# Patient Record
Sex: Male | Born: 1975 | Race: White | Hispanic: No | Marital: Married | Smoking: Current every day smoker
Health system: Southern US, Community
[De-identification: ages and names within clinical notes are randomized; demographics above are authoritative.]

## PROBLEM LIST (undated history)

## (undated) DIAGNOSIS — N289 Disorder of kidney and ureter, unspecified: Secondary | ICD-10-CM

## (undated) DIAGNOSIS — K625 Hemorrhage of anus and rectum: Secondary | ICD-10-CM

## (undated) HISTORY — PX: CARDIAC SURGERY: SHX584

## (undated) HISTORY — DX: Hemorrhage of anus and rectum: K62.5

---

## 2012-05-11 ENCOUNTER — Emergency Department (HOSPITAL_COMMUNITY): Payer: PRIVATE HEALTH INSURANCE

## 2012-05-11 ENCOUNTER — Encounter (HOSPITAL_COMMUNITY): Payer: Self-pay

## 2012-05-11 ENCOUNTER — Emergency Department (HOSPITAL_COMMUNITY)
Admission: EM | Admit: 2012-05-11 | Discharge: 2012-05-11 | Disposition: A | Discharge status: Home / Self Care | Payer: PRIVATE HEALTH INSURANCE | Attending: Emergency Medicine | Admitting: Emergency Medicine

## 2012-05-11 DIAGNOSIS — Z9889 Other specified postprocedural states: Secondary | ICD-10-CM | POA: Insufficient documentation

## 2012-05-11 DIAGNOSIS — M25539 Pain in unspecified wrist: Secondary | ICD-10-CM | POA: Insufficient documentation

## 2012-05-11 DIAGNOSIS — IMO0001 Reserved for inherently not codable concepts without codable children: Secondary | ICD-10-CM | POA: Insufficient documentation

## 2012-05-11 DIAGNOSIS — M25532 Pain in left wrist: Secondary | ICD-10-CM

## 2012-05-11 MED ORDER — PREDNISONE 20 MG PO TABS: ORAL_TABLET | ORAL | Status: DC

## 2012-05-11 MED ORDER — NAPROXEN 375 MG PO TABS: 375.0000 mg | ORAL_TABLET | Freq: Two times a day (BID) | ORAL | Status: DC

## 2012-05-11 NOTE — ED Provider Notes (Signed)
History    This chart was scribed for Donnetta Hutching, MD by Marlyne Beards, ED Scribe. The patient was seen in room . Patient's care was started at 3:04 PM.    CSN: [id number]  Arrival date & time 05/11/12  1359   First MD Initiated Contact with Patient 05/11/12 1504      Chief Complaint  Patient presents with  . Wrist Pain    (Consider location/radiation/quality/duration/timing/severity/associated sxs/prior treatment) Patient is a 37 y.o. male presenting with wrist pain. The history is provided by the patient. No language interpreter was used.  Wrist Pain The problem has been gradually worsening. The symptoms are aggravated by twisting and exertion. Nothing relieves the symptoms. Treatments tried: wrist brace.   Randall Allison is a 37 y.o. male who presents to the Emergency Department complaining of moderate constant left wrist pain onset a week ago. Pt states that when he works the pain radiates up into his left. 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 Pt came into ED wearing a wrist brace. He states that when to [facility] and they believed he may have torn a tendon. Pt denies fever, chills, cough, nausea, vomiting, diarrhea, SOB, weakness, and any other associated symptoms. Pt has hx of cardiac surgery.   History reviewed. No pertinent past medical history.  Past Surgical History  Procedure Laterality Date  . Cardiac surgery      No family history on file.  History  Substance Use Topics  . Smoking status: Never Smoker   . Smokeless tobacco: Not on file  . Alcohol Use: Yes     Comment: 1 or 2 beers/day      Review of Systems  Musculoskeletal: Positive for myalgias and arthralgias.  All other systems reviewed and are negative.    Allergies  Review of patient's allergies indicates no known allergies.  Home Medications  No current outpatient prescriptions on file.  BP 124/83  Pulse 118  Temp(Src) 97.2 F (36.2 C)  (Oral)  Resp 18  Ht 5\' 8"  (1.727 m)  Wt 151 lb (68.493 kg)  BMI 22.96 kg/m2  SpO2 98%  Physical Exam  Nursing note and vitals reviewed. Constitutional: He is oriented to person, place, and time. He appears well-developed and well-nourished.  HENT:  Head: Normocephalic and atraumatic.  Abdominal: Bowel sounds are normal.  Musculoskeletal: He exhibits tenderness.  Tender upon palpation on his left anterior and posterior wrist which goes to the dorsal aspect of his forearm. Pt has a weaker grip in his left hand and strength has decreased.  Neurological: He is alert and oriented to person, place, and time.  Skin: Skin is warm and dry.  Psychiatric: He has a normal mood and affect.    ED Course  Procedures (including critical care time) DIAGNOSTIC STUDIES: Oxygen Saturation is 98% on room air, normal by my interpretation.    COORDINATION OF CARE: 3:14 PM Discussed ED treatment with pt and pt agrees. Discussed with pt of prescmedications and wrist immobilizer. Consulted with pt about giving note to miss work for boss.    Labs Reviewed - No data to display Dg Forearm Left  05/11/2012  *RADIOLOGY REPORT*  Clinical Data: Left wrist and forearm pain and swelling.  No known injury.  LEFT FOREARM - 2 VIEW  Comparison: None.  Findings: Two-view exam shows no evidence of fracture.  No worrisome lytic or sclerotic osseous abnormality.  Overlying soft tissues are unremarkable.  IMPRESSION: Normal exam.  Original Report Authenticated By: Kennith Center, M.D.    Dg Wrist Complete Left  05/11/2012  *RADIOLOGY REPORT*  Clinical Data: Left wrist and forearm pain and swelling.  No history of injury.  LEFT WRIST - COMPLETE 3+ VIEW  Comparison: None.  Findings: There is no evidence for acute fracture.  No dislocation. Carpal alignment is intact.  Soft tissues are unremarkable.  IMPRESSION: Normal exam.   Original Report Authenticated By: Kennith Center, M.D.      No diagnosis found.    MDM  History  and physical consistent with overuse syndrome. Splint, referral to hand surgeon, wrist splint,  Naprosyn 375 mg #30, prednisone taper.  Note given for patient to give to  employer    I personally performed the services described in this documentation, which was scribed in my presence. The recorded information has been reviewed and is accurate.         Donnetta Hutching, MD 05/11/12 [phone]

## 2012-05-11 NOTE — ED Notes (Signed)
Pt c/o pain in left wrist x 1 week.  Says was told at [facility] that he may have torn a tendon.  Pt wearing wrist brace.

## 2012-05-11 NOTE — ED Notes (Signed)
Pt alert & oriented x4, stable gait. Patient given discharge instructions, paperwork & prescription(s). Patient  instructed to stop at the registration desk to finish any additional paperwork. Patient verbalized understanding. Pt left department w/ no further questions. 

## 2016-01-18 ENCOUNTER — Emergency Department (HOSPITAL_COMMUNITY)
Admission: EM | Admit: 2016-01-18 | Discharge: 2016-01-19 | Disposition: A | Discharge status: Home / Self Care | Payer: Self-pay | Attending: Emergency Medicine | Admitting: Emergency Medicine

## 2016-01-18 ENCOUNTER — Emergency Department (HOSPITAL_COMMUNITY): Payer: Self-pay

## 2016-01-18 ENCOUNTER — Encounter (HOSPITAL_COMMUNITY): Payer: Self-pay | Admitting: *Deleted

## 2016-01-18 DIAGNOSIS — J40 Bronchitis, not specified as acute or chronic: Secondary | ICD-10-CM | POA: Insufficient documentation

## 2016-01-18 DIAGNOSIS — Z79899 Other long term (current) drug therapy: Secondary | ICD-10-CM | POA: Insufficient documentation

## 2016-01-18 HISTORY — DX: Disorder of kidney and ureter, unspecified: N28.9

## 2016-01-18 LAB — CBC WITH DIFFERENTIAL/PLATELET
BASOS ABS: 0 10*3/uL (ref 0.0–0.1)
BASOS PCT: 0 %
EOS ABS: 0 10*3/uL (ref 0.0–0.7)
Eosinophils Relative: 0 %
HCT: 41.8 % (ref 39.0–52.0)
HEMOGLOBIN: 13.8 g/dL (ref 13.0–17.0)
Lymphocytes Relative: 17 %
Lymphs Abs: 1.2 10*3/uL (ref 0.7–4.0)
MCH: 32.3 pg (ref 26.0–34.0)
MCHC: 33 g/dL (ref 30.0–36.0)
MCV: 97.9 fL (ref 78.0–100.0)
MONOS PCT: 10 %
Monocytes Absolute: 0.8 10*3/uL (ref 0.1–1.0)
NEUTROS PCT: 73 %
Neutro Abs: 5.2 10*3/uL (ref 1.7–7.7)
Platelets: 173 10*3/uL (ref 150–400)
RBC: 4.27 MIL/uL (ref 4.22–5.81)
RDW: 12.3 % (ref 11.5–15.5)
WBC: 7.2 10*3/uL (ref 4.0–10.5)

## 2016-01-18 LAB — I-STAT CHEM 8, ED
BUN: 9 mg/dL (ref 6–20)
Calcium, Ion: 1.05 mmol/L — ABNORMAL LOW (ref 1.15–1.40)
Chloride: 98 mmol/L — ABNORMAL LOW (ref 101–111)
Creatinine, Ser: 0.8 mg/dL (ref 0.61–1.24)
Glucose, Bld: 126 mg/dL — ABNORMAL HIGH (ref 65–99)
HEMATOCRIT: 40 % (ref 39.0–52.0)
Hemoglobin: 13.6 g/dL (ref 13.0–17.0)
POTASSIUM: 3.6 mmol/L (ref 3.5–5.1)
SODIUM: 139 mmol/L (ref 135–145)
TCO2: 28 mmol/L (ref 0–100)

## 2016-01-18 MED ORDER — METOCLOPRAMIDE HCL 5 MG/ML IJ SOLN
10.0000 mg | Freq: Once | INTRAMUSCULAR | Status: AC
  Administered 2016-01-18: 10 mg via INTRAVENOUS
  Filled 2016-01-18: qty 2

## 2016-01-18 MED ORDER — SODIUM CHLORIDE 0.9 % IV BOLUS (SEPSIS)
1000.0000 mL | Freq: Once | INTRAVENOUS | Status: AC
  Administered 2016-01-18: 1000 mL via INTRAVENOUS

## 2016-01-18 MED ORDER — KETOROLAC TROMETHAMINE 30 MG/ML IJ SOLN
30.0000 mg | Freq: Once | INTRAMUSCULAR | Status: AC
  Administered 2016-01-18: 30 mg via INTRAVENOUS
  Filled 2016-01-18: qty 1

## 2016-01-18 MED ORDER — DIPHENHYDRAMINE HCL 50 MG/ML IJ SOLN
25.0000 mg | Freq: Once | INTRAMUSCULAR | Status: AC
  Administered 2016-01-18: 25 mg via INTRAVENOUS
  Filled 2016-01-18: qty 1

## 2016-01-18 NOTE — ED Provider Notes (Signed)
[facility] DEPT Provider Note   CSN: [id number] Arrival date & time: 01/18/16  2100     History   Chief Complaint Chief Complaint  Patient presents with  . Generalized Body Aches    HPI Randall Allison is a 41 y.o. male.  HPI   Randall Allison is a 41 y.o. male who presents to the Emergency Department complaining of generalized body aches, sore throat, cough chills and fatigue for two weeks.  He describes a cough that is productive at times and associated with chest tightness.  He also describes a frontal headache for several days throbbing in quality and worse with cough.  He states that his neck and back hurt secondary to coughing.  He has tried OTC cough and cold medications without relief.  He denies chest pain, shortness of breath, abdominal pain, fever, , vomiting, neck stiffness, rash or urinary symptoms.     Past Medical History:  Diagnosis Date  . kidney stones     There are no active problems to display for this patient.   Past Surgical History:  Procedure Laterality Date  . CARDIAC SURGERY     41 years old       Home Medications    Prior to Admission medications   Medication Sig Start Date End Date Taking? Authorizing Provider  acetaminophen (TYLENOL) 500 MG tablet Take 500 mg by mouth every 6 (six) hours as needed for mild pain or moderate pain.   Yes Historical Provider, MD  Aspirin-Acetaminophen-Caffeine (GOODY HEADACHE PO) Take 1-2 packets by mouth daily as needed (for pain).   Yes Historical Provider, MD  aspirin-sod bicarb-citric acid (ALKA-SELTZER) 325 MG TBEF tablet Take 325 mg by mouth every 6 (six) hours as needed.   Yes Historical Provider, MD  Dextromethorphan HBr (COUGH SUPPRESSANT PO) Take 5-10 mLs by mouth daily as needed (for cough).   Yes Historical Provider, MD  Phenylephrine-DM-GG-APAP (TYLENOL COLD/FLU SEVERE) 5-10-200-325 MG TABS Take 1-2 tablets by mouth daily as needed (for cold and flu symptoms).   Yes Historical Provider, MD    Pseudoeph-Doxylamine-DM-APAP (DAYQUIL/NYQUIL COLD/FLU RELIEF PO) Take 1-2 capsules by mouth daily as needed (for cold symptoms).   Yes Historical Provider, MD    Family History History reviewed. No pertinent family history.  Social History Social History  Substance Use Topics  . Smoking status: Never Smoker  . Smokeless tobacco: Never Used  . Alcohol use Yes     Comment: 1 or 2 beers/day     Allergies   Patient has no known allergies.   Review of Systems Review of Systems  Constitutional: Positive for chills. Negative for activity change, appetite change and fever.  HENT: Positive for congestion, rhinorrhea and sore throat. Negative for facial swelling and trouble swallowing.   Eyes: Negative for visual disturbance.  Respiratory: Positive for cough and chest tightness. Negative for shortness of breath, wheezing and stridor.   Cardiovascular: Negative for chest pain.  Gastrointestinal: Negative for abdominal pain, nausea and vomiting.  Genitourinary: Negative for difficulty urinating, dysuria and flank pain.  Musculoskeletal: Positive for back pain, myalgias and neck pain. Negative for arthralgias and neck stiffness.  Skin: Negative.  Negative for rash.  Neurological: Negative for dizziness, weakness, numbness and headaches.  Hematological: Negative for adenopathy.  Psychiatric/Behavioral: Negative for confusion.  All other systems reviewed and are negative.    Physical Exam Updated Vital Signs BP 113/73 (BP Location: Right Arm)   Pulse 93   Temp 97.4 F (36.3 C) (Oral)   Resp 18  Ht 5\' 8"  (1.727 m)   Wt 68 kg   SpO2 97%   BMI 22.81 kg/m   Physical Exam  Constitutional: He is oriented to person, place, and time. He appears well-developed and well-nourished. No distress.  HENT:  Head: Normocephalic and atraumatic.  Right Ear: Tympanic membrane and ear canal normal.  Left Ear: Tympanic membrane and ear canal normal.  Nose: No rhinorrhea.  Mouth/Throat: Uvula  is midline and mucous membranes are normal. No uvula swelling. Posterior oropharyngeal erythema present. No oropharyngeal exudate, posterior oropharyngeal edema or tonsillar abscesses.  Eyes: EOM are normal. Pupils are equal, round, and reactive to light.  Neck: Trachea normal, normal range of motion and phonation normal. Neck supple. No spinous process tenderness and no muscular tenderness present. No neck rigidity. No Kernig's sign noted.  Cardiovascular: Normal rate, regular rhythm and intact distal pulses.   No murmur heard. Pulmonary/Chest: Effort normal. No respiratory distress.  Coarse lung sounds, no rales.  Few scattered expiratory wheezes.    Abdominal: Soft. He exhibits no distension. There is no tenderness. There is no guarding.  Musculoskeletal: Normal range of motion.  Neurological: He is alert and oriented to person, place, and time. He has normal strength. No cranial nerve deficit or sensory deficit. He exhibits normal muscle tone. Coordination and gait normal. GCS eye subscore is 4. GCS verbal subscore is 5. GCS motor subscore is 6.  Reflex Scores:      Tricep reflexes are 2+ on the right side and 2+ on the left side.      Bicep reflexes are 2+ on the right side and 2+ on the left side. Skin: Skin is warm and dry.  Psychiatric: He has a normal mood and affect.  Nursing note and vitals reviewed.    ED Treatments / Results  Labs (all labs ordered are listed, but only abnormal results are displayed) Labs Reviewed  I-STAT CHEM 8, ED - Abnormal; Notable for the following:       Result Value   Chloride 98 (*)    Glucose, Bld 126 (*)    Calcium, Ion 1.05 (*)    All other components within normal limits  CBC WITH DIFFERENTIAL/PLATELET  URINALYSIS, ROUTINE W REFLEX MICROSCOPIC    EKG  EKG Interpretation None       Radiology Dg Chest 2 View  Result Date: 01/18/2016 CLINICAL DATA:  Headache, myalgias, sore throat and productive cough for 2 weeks. EXAM: CHEST  2 VIEW  COMPARISON:  None. FINDINGS: Minor linear lung base opacities bilaterally due to scarring or atelectasis. There is no airspace consolidation. There is no effusion. Pulmonary vasculature is normal. Hilar, mediastinal and cardiac contours are unremarkable. IMPRESSION: Linear scarring or atelectasis in the bases. No consolidation. No effusion. Electronically Signed   By: Andreas Newport M.D.   On: 01/18/2016 23:44    Procedures Procedures (including critical care time)  Medications Ordered in ED Medications  sodium chloride 0.9 % bolus 1,000 mL (1,000 mLs Intravenous New Bag/Given 01/18/16 2303)  ketorolac (TORADOL) 30 MG/ML injection 30 mg (30 mg Intravenous Given 01/18/16 2304)  metoCLOPramide (REGLAN) injection 10 mg (10 mg Intravenous Given 01/18/16 2304)  diphenhydrAMINE (BENADRYL) injection 25 mg (25 mg Intravenous Given 01/18/16 2304)     Initial Impression / Assessment and Plan / ED Course  I have reviewed the triage vital signs and the nursing notes.  Pertinent labs & imaging results that were available during my care of the patient were reviewed by me and considered in my  medical decision making (see chart for details).  Clinical Course    Pt with flu like symptoms for two weeks.  Headache of gradual onset.  No nuchal rigidity.  Non-toxic appearing.  Vitals stable.  No hx of fever.    Pt also seen by Dr. Roderic Palau and care plan discussed  0110  On recheck, pt is resting comfortably.  No respiratory distress noted.  Headache improved.  He has ate crackers and drank fluids.  Reports feeling better and requesting discharge.  I will tx for bronchitis.  Anti-tussive, Z pack, ibuprofen and albuterol MDI dispensed.  Referral info given to establish PCP.  Return precautions also given.   Final Clinical Impressions(s) / ED Diagnoses   Final diagnoses:  Bronchitis    New Prescriptions New Prescriptions   No medications on file     Bufford Lope 01/19/16 0125    Milton Ferguson,  MD 01/19/16 2350

## 2016-01-18 NOTE — ED Triage Notes (Signed)
Pt has headache, generalized body aches, sore throat, back pain, fatigue, and productive cough (yellowish phlegm) since Christmas Eve.

## 2016-01-19 LAB — URINALYSIS, ROUTINE W REFLEX MICROSCOPIC
BILIRUBIN URINE: NEGATIVE
Glucose, UA: NEGATIVE mg/dL
HGB URINE DIPSTICK: NEGATIVE
KETONES UR: NEGATIVE mg/dL
Leukocytes, UA: NEGATIVE
NITRITE: NEGATIVE
PH: 7 (ref 5.0–8.0)
Protein, ur: NEGATIVE mg/dL
Specific Gravity, Urine: 1.011 (ref 1.005–1.030)

## 2016-01-19 MED ORDER — PROMETHAZINE-DM 6.25-15 MG/5ML PO SYRP: 5.0000 mL | ORAL_SOLUTION | Freq: Four times a day (QID) | ORAL | 0 refills | Status: DC | PRN

## 2016-01-19 MED ORDER — AZITHROMYCIN 250 MG PO TABS: ORAL_TABLET | ORAL | 0 refills | Status: DC

## 2016-01-19 MED ORDER — ALBUTEROL SULFATE HFA 108 (90 BASE) MCG/ACT IN AERS
2.0000 | INHALATION_SPRAY | Freq: Once | RESPIRATORY_TRACT | Status: AC
  Administered 2016-01-19: 2 via RESPIRATORY_TRACT
  Filled 2016-01-19: qty 6.7

## 2016-01-19 MED ORDER — IBUPROFEN 800 MG PO TABS: 800.0000 mg | ORAL_TABLET | Freq: Three times a day (TID) | ORAL | 0 refills | Status: DC

## 2016-01-19 NOTE — ED Notes (Signed)
Pt and family updated on plan of care, still unable to obtain urine sample,

## 2016-01-19 NOTE — Discharge Instructions (Signed)
2 puffs of the inhaler 4 times a day as needed.  Plenty of fluids.  You can contact one of the clinics listed to establish primary care.  Return here for any worsening symptoms

## 2018-11-05 ENCOUNTER — Emergency Department (HOSPITAL_COMMUNITY): Payer: Self-pay

## 2018-11-05 ENCOUNTER — Emergency Department (HOSPITAL_COMMUNITY)
Admission: EM | Admit: 2018-11-05 | Discharge: 2018-11-05 | Disposition: A | Discharge status: Home / Self Care | Payer: Self-pay | Attending: Emergency Medicine | Admitting: Emergency Medicine

## 2018-11-05 ENCOUNTER — Other Ambulatory Visit: Payer: Self-pay

## 2018-11-05 ENCOUNTER — Encounter (HOSPITAL_COMMUNITY): Payer: Self-pay | Admitting: Emergency Medicine

## 2018-11-05 DIAGNOSIS — R1013 Epigastric pain: Secondary | ICD-10-CM | POA: Insufficient documentation

## 2018-11-05 DIAGNOSIS — R1010 Upper abdominal pain, unspecified: Secondary | ICD-10-CM

## 2018-11-05 DIAGNOSIS — Z79899 Other long term (current) drug therapy: Secondary | ICD-10-CM | POA: Insufficient documentation

## 2018-11-05 LAB — CBC
HCT: 47.9 % (ref 39.0–52.0)
Hemoglobin: 15.6 g/dL (ref 13.0–17.0)
MCH: 31.6 pg (ref 26.0–34.0)
MCHC: 32.6 g/dL (ref 30.0–36.0)
MCV: 97.2 fL (ref 80.0–100.0)
Platelets: 288 10*3/uL (ref 150–400)
RBC: 4.93 MIL/uL (ref 4.22–5.81)
RDW: 12.2 % (ref 11.5–15.5)
WBC: 7.5 10*3/uL (ref 4.0–10.5)
nRBC: 0 % (ref 0.0–0.2)

## 2018-11-05 LAB — DIFFERENTIAL
Abs Immature Granulocytes: 0.01 10*3/uL (ref 0.00–0.07)
Basophils Absolute: 0.1 10*3/uL (ref 0.0–0.1)
Basophils Relative: 1 %
Eosinophils Absolute: 0.2 10*3/uL (ref 0.0–0.5)
Eosinophils Relative: 2 %
Immature Granulocytes: 0 %
Lymphocytes Relative: 34 %
Lymphs Abs: 2.6 10*3/uL (ref 0.7–4.0)
Monocytes Absolute: 0.5 10*3/uL (ref 0.1–1.0)
Monocytes Relative: 7 %
Neutro Abs: 4.1 10*3/uL (ref 1.7–7.7)
Neutrophils Relative %: 56 %

## 2018-11-05 LAB — COMPREHENSIVE METABOLIC PANEL
ALT: 27 U/L (ref 0–44)
AST: 20 U/L (ref 15–41)
Albumin: 4 g/dL (ref 3.5–5.0)
Alkaline Phosphatase: 98 U/L (ref 38–126)
Anion gap: 7 (ref 5–15)
BUN: 13 mg/dL (ref 6–20)
CO2: 27 mmol/L (ref 22–32)
Calcium: 8.7 mg/dL — ABNORMAL LOW (ref 8.9–10.3)
Chloride: 104 mmol/L (ref 98–111)
Creatinine, Ser: 0.91 mg/dL (ref 0.61–1.24)
GFR calc Af Amer: >60 mL/min (ref >60)
GFR calc non Af Amer: >60 mL/min (ref >60)
Glucose, Bld: 138 mg/dL — ABNORMAL HIGH (ref 70–99)
Potassium: 4.6 mmol/L (ref 3.5–5.1)
Sodium: 138 mmol/L (ref 135–145)
Total Bilirubin: 0.3 mg/dL (ref 0.3–1.2)
Total Protein: 7.3 g/dL (ref 6.5–8.1)

## 2018-11-05 LAB — LIPASE, BLOOD: Lipase: 44 U/L (ref 11–51)

## 2018-11-05 MED ORDER — HYDROMORPHONE HCL 1 MG/ML IJ SOLN
1.0000 mg | Freq: Once | INTRAMUSCULAR | Status: AC
  Administered 2018-11-05: 1 mg via INTRAVENOUS
  Filled 2018-11-05: qty 1

## 2018-11-05 MED ORDER — PANTOPRAZOLE SODIUM 40 MG IV SOLR
40.0000 mg | Freq: Once | INTRAVENOUS | Status: AC
  Administered 2018-11-05: 40 mg via INTRAVENOUS
  Filled 2018-11-05: qty 40

## 2018-11-05 MED ORDER — LORAZEPAM 2 MG/ML IJ SOLN
0.5000 mg | Freq: Once | INTRAMUSCULAR | Status: AC
  Administered 2018-11-05: 0.5 mg via INTRAVENOUS
  Filled 2018-11-05: qty 1

## 2018-11-05 MED ORDER — HYDROCODONE-ACETAMINOPHEN 5-325 MG PO TABS: 1.0000 | ORAL_TABLET | Freq: Four times a day (QID) | ORAL | 0 refills | Status: DC | PRN

## 2018-11-05 MED ORDER — ONDANSETRON HCL 4 MG/2ML IJ SOLN
4.0000 mg | Freq: Once | INTRAMUSCULAR | Status: AC
  Administered 2018-11-05: 4 mg via INTRAVENOUS
  Filled 2018-11-05: qty 2

## 2018-11-05 MED ORDER — IOHEXOL 300 MG/ML  SOLN
100.0000 mL | Freq: Once | INTRAMUSCULAR | Status: AC | PRN
  Administered 2018-11-05: 100 mL via INTRAVENOUS

## 2018-11-05 MED ORDER — SODIUM CHLORIDE 0.9 % IV BOLUS
1000.0000 mL | Freq: Once | INTRAVENOUS | Status: AC
  Administered 2018-11-05: 1000 mL via INTRAVENOUS

## 2018-11-05 MED ORDER — SODIUM CHLORIDE 0.9% FLUSH
3.0000 mL | Freq: Once | INTRAVENOUS | Status: AC
  Administered 2018-11-05: 3 mL via INTRAVENOUS

## 2018-11-05 NOTE — Discharge Instructions (Addendum)
Follow-up with Dr. Constance Haw on Tuesday at 1015.  Contact her Monday morning if this is not a convenient time and return if any problem

## 2018-11-05 NOTE — ED Triage Notes (Signed)
Patient c/o mid abd pain that started last night. Patient reports nausea and vomiting. Denies any diarrhea or fevers. Last BM 2 days ago which is usually for him. Patient reports rectal bleeding last week with a large amount of bright red blood-denies any more since then.

## 2018-11-05 NOTE — ED Provider Notes (Signed)
[facility] EMERGENCY DEPARTMENT Provider Note   CSN: [id number] Arrival date & time: 11/05/18  1555     History   Chief Complaint Chief Complaint  Patient presents with   Abdominal Pain    HPI Randall Allison is a 43 y.o. male.     Patient complains of pain superior to his umbilicus.  This been hurting off and on for a while.  It was severe today  The history is provided by the patient. No language interpreter was used.  Abdominal Pain Pain location:  Epigastric Pain quality: aching   Pain radiates to:  Does not radiate Pain severity:  Moderate Onset quality:  Sudden Timing:  Constant Progression:  Worsening Chronicity:  New Context: not alcohol use   Associated symptoms: no chest pain, no cough, no diarrhea, no fatigue and no hematuria     Past Medical History:  Diagnosis Date   kidney stones     There are no active problems to display for this patient.   Past Surgical History:  Procedure Laterality Date   CARDIAC SURGERY     43 years old        Home Medications    Prior to Admission medications   Medication Sig Start Date End Date Taking? Authorizing Provider  Aspirin-Acetaminophen-Caffeine (GOODY HEADACHE PO) Take 1-2 packets by mouth 3 (three) times daily.    Yes [provider]  calcium carbonate (TUMS EX) 750 MG chewable tablet Chew 1 tablet by mouth daily as needed for heartburn.   Yes [provider]  HYDROcodone-acetaminophen (NORCO/VICODIN) 5-325 MG tablet Take 1 tablet by mouth every 6 (six) hours as needed. 11/05/18   Milton Ferguson, MD    Family History History reviewed. No pertinent family history.  Social History Social History   Tobacco Use   Smoking status: Never Smoker   Smokeless tobacco: Never Used  Substance Use Topics   Alcohol use: Yes    Comment: 1 or 2 beers/day   Drug use: No     Allergies   Patient has no known allergies.   Review of Systems Review of Systems  Constitutional:  Negative for appetite change and fatigue.  HENT: Negative for congestion, ear discharge and sinus pressure.   Eyes: Negative for discharge.  Respiratory: Negative for cough.   Cardiovascular: Negative for chest pain.  Gastrointestinal: Positive for abdominal pain. Negative for diarrhea.  Genitourinary: Negative for frequency and hematuria.  Musculoskeletal: Negative for back pain.  Skin: Negative for rash.  Neurological: Negative for seizures and headaches.  Psychiatric/Behavioral: Negative for hallucinations.     Physical Exam Updated Vital Signs BP 112/81    Pulse 96    Temp (!) 97.5 F (36.4 C) (Oral)    Resp 17    Ht 5\' 9"  (1.753 m)    Wt 68.9 kg    SpO2 97%    BMI 22.45 kg/m   Physical Exam Vitals signs and nursing note reviewed.  Constitutional:      Appearance: He is well-developed.  HENT:     Head: Normocephalic.     Nose: Nose normal.     Mouth/Throat:     Mouth: Mucous membranes are moist.  Eyes:     General: No scleral icterus.    Conjunctiva/sclera: Conjunctivae normal.  Neck:     Musculoskeletal: Neck supple.     Thyroid: No thyromegaly.  Cardiovascular:     Rate and Rhythm: Normal rate and regular rhythm.     Heart sounds: No murmur. No friction rub.  No gallop.   Pulmonary:     Breath sounds: No stridor. No wheezing or rales.  Chest:     Chest wall: No tenderness.  Abdominal:     General: There is no distension.     Tenderness: There is abdominal tenderness. There is no rebound.  Musculoskeletal: Normal range of motion.  Lymphadenopathy:     Cervical: No cervical adenopathy.  Skin:    Findings: No erythema or rash.  Neurological:     Mental Status: He is oriented to person, place, and time.     Motor: No abnormal muscle tone.     Coordination: Coordination normal.  Psychiatric:        Behavior: Behavior normal.      ED Treatments / Results  Labs (all labs ordered are listed, but only abnormal results are displayed) Labs Reviewed    COMPREHENSIVE METABOLIC PANEL - Abnormal; Notable for the following components:      Result Value   Glucose, Bld 138 (*)    Calcium 8.7 (*)    All other components within normal limits  LIPASE, BLOOD  CBC  DIFFERENTIAL  URINALYSIS, ROUTINE W REFLEX MICROSCOPIC    EKG None  Radiology Ct Abdomen Pelvis W Contrast  Result Date: 11/05/2018 CLINICAL DATA:  Abdominal distension, mid abdominal pain, nausea, vomiting, episode of rectal bleeding EXAM: CT ABDOMEN AND PELVIS WITH CONTRAST TECHNIQUE: Multidetector CT imaging of the abdomen and pelvis was performed using the standard protocol following bolus administration of intravenous contrast. CONTRAST:  161mL OMNIPAQUE IOHEXOL 300 MG/ML  SOLN COMPARISON:  None. FINDINGS: Lower chest: No acute abnormality. Hepatobiliary: No solid liver abnormality is seen. Calcified gallstone in the gallbladder. No gallbladder wall thickening, or biliary dilatation. Pancreas: Unremarkable. No pancreatic ductal dilatation or surrounding inflammatory changes. Spleen: Normal in size without significant abnormality. Adrenals/Urinary Tract: Adrenal glands are unremarkable. Kidneys are normal, without renal calculi, solid lesion, or hydronephrosis. Bladder is unremarkable. Stomach/Bowel: Stomach is within normal limits. Appendix appears normal. No evidence of bowel wall thickening, distention, or inflammatory changes. Sigmoid diverticulosis. Large burden of stool in the proximal colon. Vascular/Lymphatic: Scattered IVC and iliac vein calcifications. No enlarged abdominal or pelvic lymph nodes. Reproductive: No mass or other significant abnormality. Other: There is a fat containing midline ventral epigastric hernia, measuring approximately 4.0 x 3.2 cm, hernia neck measuring 2.3 cm (series 2, image 39). No abdominopelvic ascites. Musculoskeletal: No acute or significant osseous findings. IMPRESSION: 1. There is a fat containing midline ventral epigastric hernia, measuring  approximately 4.0 x 3.2 cm, hernia neck measuring 2.3 cm (series 2, image 39). 2.  Cholelithiasis. 3.  Sigmoid diverticulosis. 4. 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 Electronically Signed   By: Eddie Candle M.D.   On: 11/05/2018 19:05    Procedures Procedures (including critical care time)  Medications Ordered in ED Medications  sodium chloride flush (NS) 0.9 % injection 3 mL (3 mLs Intravenous Given 11/05/18 1623)  HYDROmorphone (DILAUDID) injection 1 mg (1 mg Intravenous Given 11/05/18 1622)  LORazepam (ATIVAN) injection 0.5 mg (0.5 mg Intravenous Given 11/05/18 1629)  ondansetron (ZOFRAN) injection 4 mg (4 mg Intravenous Given 11/05/18 1622)  sodium chloride 0.9 % bolus 1,000 mL (0 mLs Intravenous Stopped 11/05/18 1928)  iohexol (OMNIPAQUE) 300 MG/ML solution 100 mL (100 mLs Intravenous Contrast Given 11/05/18 1814)  pantoprazole (PROTONIX) injection 40 mg (40 mg Intravenous Given 11/05/18 1928)     Initial Impression / Assessment and Plan / ED Course  I  have reviewed the triage vital signs and the nursing notes.  Pertinent labs & imaging results that were available during my care of the patient were reviewed by me and considered in my medical decision making (see chart for details).        Patient's pain improved with pain medicine.  CT scan shows fat-containing hernias.  Umbilicus.  I spoke with Dr. Constance Haw general surgery and she will see the patient in 2 days and he will be discharged home with pain medicine  Final Clinical Impressions(s) / ED Diagnoses   Final diagnoses:  Pain of upper abdomen    ED Discharge Orders         Ordered    HYDROcodone-acetaminophen (NORCO/VICODIN) 5-325 MG tablet  Every 6 hours PRN     11/05/18 1949           Milton Ferguson, MD 11/05/18 1951

## 2018-11-08 ENCOUNTER — Other Ambulatory Visit: Payer: Self-pay

## 2018-11-08 ENCOUNTER — Encounter: Payer: Self-pay | Admitting: General Surgery

## 2018-11-08 ENCOUNTER — Ambulatory Visit (INDEPENDENT_AMBULATORY_CARE_PROVIDER_SITE_OTHER): Payer: Self-pay | Admitting: General Surgery

## 2018-11-08 VITALS — BP 111/72 | HR 89 | Temp 96.8°F | Resp 16 | Ht 69.0 in | Wt 165.0 lb

## 2018-11-08 DIAGNOSIS — K439 Ventral hernia without obstruction or gangrene: Secondary | ICD-10-CM

## 2018-11-08 DIAGNOSIS — R112 Nausea with vomiting, unspecified: Secondary | ICD-10-CM

## 2018-11-08 DIAGNOSIS — K625 Hemorrhage of anus and rectum: Secondary | ICD-10-CM

## 2018-11-08 NOTE — Progress Notes (Signed)
[facility] History and Physical  Reason for Referral: Supraumbilical hernia  Referring Physician:  ED   Chief Complaint    Umbilical Hernia      Randall Allison is a 43 y.o. male.  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 He has had a worsening bulge per his report, and his wife says she has noticed it for a while, possibly longer than the 6 months.  The patient says that the area becomes very painful at times, but he also has some epigastric /upper chest pain and nausea/vomiting that comes and goes and is not associated with food and no RUQ pain. The bulge does not have to super painful or tender when he gets these episodes of nausea/vomiting. He denise any weight loss, and say that he can have constipation and then have loose stools. He does report some rectal bleeding on occasion.  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  He is in the truck which is bumpy, and says that his symptoms are worse but his diet is also worse during that time period.  He has never been seen by GI and does have some reflux symptoms and takes Goodie's powder per his medicine history.  He has no family history of any colon cancer that he is aware.    He had cardiac surgery as a child at age 62 yo. He says he had a murmur and had to get open heart surgery.  Based on his description it sounds like he had a VSD repair potentially. He has had no further cardiac issues, no chest pain, no shortness of breath.   Past Medical History:  Diagnosis Date  . BRBPR (bright red blood per rectum)   . kidney stones     Past Surgical History:  Procedure Laterality Date  . CARDIAC SURGERY     43 years old    Family History  Problem Relation Age of Onset  . Hernia Father   . Prostate cancer Father   . Renal cancer Father   . Hernia Brother     Social History    Tobacco Use  . Smoking status: Never Smoker  . Smokeless tobacco: Never Used  Substance Use Topics  . Alcohol use: Yes    Comment: 1 or 2 beers/day  . Drug use: No    Medications: I have reviewed the patient's current medications. Allergies as of 11/08/2018   No Known Allergies     Medication List       Accurate as of November 08, 2018 11:59 PM. If you have any questions, ask your nurse or doctor.        calcium carbonate 750 MG chewable tablet Commonly known as: TUMS EX Chew 1 tablet by mouth daily as needed for heartburn.   GOODY HEADACHE PO Take 1-2 packets by mouth 3 (three) times daily.   HYDROcodone-acetaminophen 5-325 MG tablet Commonly known as: NORCO/VICODIN Take 1 tablet by mouth every 6 (six) hours as needed.        ROS:  A comprehensive review of systems was negative except for: Gastrointestinal: positive for abdominal pain, nausea, reflux symptoms, vomiting and BRBPR Neurological: positive for dizziness  Blood pressure 111/72, pulse 89, temperature (!) 96.8 F (36 C), temperature source Temporal, resp. rate 16, height 5\' 9"  (1.753 m),  weight 165 lb (74.8 kg), SpO2 97 %. Physical Exam Vitals signs reviewed.  Constitutional:      Appearance: Normal appearance.  HENT:     Head: Normocephalic and atraumatic.     Nose: Nose normal.     Mouth/Throat:     Mouth: Mucous membranes are moist.  Eyes:     Extraocular Movements: Extraocular movements intact.     Pupils: Pupils are equal, round, and reactive to light.  Neck:     Musculoskeletal: Normal range of motion. No neck rigidity.  Cardiovascular:     Rate and Rhythm: Normal rate and regular rhythm.  Pulmonary:     Effort: Pulmonary effort is normal.     Breath sounds: Normal breath sounds.  Abdominal:     General: There is no distension.     Palpations: Abdomen is soft.     Tenderness: There is no abdominal tenderness.     Hernia: A hernia is present.     Comments: Supraumbilical hernia, tender,  about 2-3cm defect, reducible  Genitourinary:    Prostate: Not tender.     Rectum: No mass, anal fissure or external hemorrhoid.     Comments: No gross blood Musculoskeletal: Normal range of motion.        General: No swelling.  Skin:    General: Skin is warm and dry.  Neurological:     General: No focal deficit present.     Mental Status: He is alert and oriented to person, place, and time.  Psychiatric:        Mood and Affect: Mood normal.        Behavior: Behavior normal.        Thought Content: Thought content normal.     Results: CT a/p personally reviewed- hernia supraumbilical- about 5 cm from the umbilicus, about [id number] wide, containing fat, gallstones  Assessment & Plan:  Randall Allison is a 43 y.o. male with a supraumbilical hernia that is tender and causing some of his pain but I do not think it is causing all of his issues and he has several things going on at this time. He has nausea/vomiting and epigastric pain which could be from gallstones but also could be from gastritis or an ulcer. He denied any RUQ pain or pain with eating. He also has some rectal bleeding that has been going on for a while, and no obvious external hemorrhoids, but potentially has internal hemorrhoids.   -Supraumbilical hernia repair with mesh  -Remaining symptoms should be evaluated by GI to assess with possible EGD and colonoscopy, he may ultimately need his gallbladder out but has no pain with food or in the RUQ so need to rule out ulcer disease etc first    Discussed risk of hernia repair including but not limited to bleeding, infection, recurrence, use of mesh, risk of injury of the bowel, and not helping all of his symptoms including the nausea/vomiting.   Discussed cardiac history with Dr. Wyatt Haste and patient not symptomatic. Do not think we need to get cardiology to see him or ECHO at this time.    All questions were answered to the satisfaction of the patient and family.  I spent 45  minutes with this patient and his wife discussing the symptoms, the options and the plan of care as detailed above. I also called Dr. Wyatt Haste and sent Dr. Laural Golden a message.    [name] 11/10/2018, 8:05 AM

## 2018-11-09 ENCOUNTER — Other Ambulatory Visit (HOSPITAL_COMMUNITY)
Admission: RE | Admit: 2018-11-09 | Discharge: 2018-11-09 | Disposition: A | Discharge status: Home / Self Care | Payer: Self-pay | Source: Ambulatory Visit | Attending: General Surgery | Admitting: General Surgery

## 2018-11-09 ENCOUNTER — Encounter (HOSPITAL_COMMUNITY)
Admission: RE | Admit: 2018-11-09 | Discharge: 2018-11-09 | Disposition: A | Discharge status: Home / Self Care | Payer: Self-pay | Source: Ambulatory Visit | Attending: General Surgery | Admitting: General Surgery

## 2018-11-09 DIAGNOSIS — Z20828 Contact with and (suspected) exposure to other viral communicable diseases: Secondary | ICD-10-CM | POA: Insufficient documentation

## 2018-11-09 DIAGNOSIS — Z01812 Encounter for preprocedural laboratory examination: Secondary | ICD-10-CM | POA: Insufficient documentation

## 2018-11-09 LAB — SARS CORONAVIRUS 2 (TAT 6-24 HRS): SARS Coronavirus 2: NEGATIVE

## 2018-11-10 ENCOUNTER — Encounter: Payer: Self-pay | Admitting: General Surgery

## 2018-11-10 ENCOUNTER — Other Ambulatory Visit: Payer: Self-pay

## 2018-11-10 ENCOUNTER — Encounter (HOSPITAL_COMMUNITY): Payer: Self-pay

## 2018-11-10 DIAGNOSIS — R1110 Vomiting, unspecified: Secondary | ICD-10-CM | POA: Insufficient documentation

## 2018-11-10 DIAGNOSIS — K625 Hemorrhage of anus and rectum: Secondary | ICD-10-CM | POA: Insufficient documentation

## 2018-11-10 NOTE — H&P (Signed)
[facility] History and Physical  Reason for Referral: Supraumbilical hernia  Referring Physician:  ED      Chief Complaint    Umbilical Hernia      Randall Allison is a 43 y.o. male.  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 He has had a worsening bulge per his report, and his wife says she has noticed it for a while, possibly longer than the 6 months.  The patient says that the area becomes very painful at times, but he also has some epigastric /upper chest pain and nausea/vomiting that comes and goes and is not associated with food and no RUQ pain. The bulge does not have to super painful or tender when he gets these episodes of nausea/vomiting. He denise any weight loss, and say that he can have constipation and then have loose stools. He does report some rectal bleeding on occasion.  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  He is in the truck which is bumpy, and says that his symptoms are worse but his diet is also worse during that time period.  He has never been seen by GI and does have some reflux symptoms and takes Goodie's powder per his medicine history.  He has no family history of any colon cancer that he is aware.    He had cardiac surgery as a child at age 25 yo. He says he had a murmur and had to get open heart surgery.  Based on his description it sounds like he had a VSD repair potentially. He has had no further cardiac issues, no chest pain, no shortness of breath.       Past Medical History:  Diagnosis Date  . BRBPR (bright red blood per rectum)   . kidney stones          Past Surgical History:  Procedure Laterality Date  . CARDIAC SURGERY     43 years old         Family History  Problem Relation Age of Onset  . Hernia Father   . Prostate cancer Father   . Renal cancer Father   .  Hernia Brother     Social History        Tobacco Use  . Smoking status: Never Smoker  . Smokeless tobacco: Never Used  Substance Use Topics  . Alcohol use: Yes    Comment: 1 or 2 beers/day  . Drug use: No    Medications: I have reviewed the patient's current medications. Allergies as of 11/08/2018   No Known Allergies        Medication List       Accurate as of November 08, 2018 11:59 PM. If you have any questions, ask your nurse or doctor.        calcium carbonate 750 MG chewable tablet Commonly known as: TUMS EX Chew 1 tablet by mouth daily as needed for heartburn.   GOODY HEADACHE PO Take 1-2 packets by mouth 3 (three) times daily.   HYDROcodone-acetaminophen 5-325 MG tablet Commonly known as: NORCO/VICODIN Take 1 tablet by mouth every 6 (six) hours as needed.        ROS:  A comprehensive review of systems was negative except for: Gastrointestinal: positive for abdominal pain, nausea, reflux symptoms, vomiting and BRBPR Neurological: positive  for dizziness  Blood pressure 111/72, pulse 89, temperature (!) 96.8 F (36 C), temperature source Temporal, resp. rate 16, height 5\' 9"  (1.753 m), weight 165 lb (74.8 kg), SpO2 97 %. Physical Exam Vitals signs reviewed.  Constitutional:      Appearance: Normal appearance.  HENT:     Head: Normocephalic and atraumatic.     Nose: Nose normal.     Mouth/Throat:     Mouth: Mucous membranes are moist.  Eyes:     Extraocular Movements: Extraocular movements intact.     Pupils: Pupils are equal, round, and reactive to light.  Neck:     Musculoskeletal: Normal range of motion. No neck rigidity.  Cardiovascular:     Rate and Rhythm: Normal rate and regular rhythm.  Pulmonary:     Effort: Pulmonary effort is normal.     Breath sounds: Normal breath sounds.  Abdominal:     General: There is no distension.     Palpations: Abdomen is soft.     Tenderness: There is no abdominal tenderness.      Hernia: A hernia is present.     Comments: Supraumbilical hernia, tender, about 2-3cm defect, reducible  Genitourinary:    Prostate: Not tender.     Rectum: No mass, anal fissure or external hemorrhoid.     Comments: No gross blood Musculoskeletal: Normal range of motion.        General: No swelling.  Skin:    General: Skin is warm and dry.  Neurological:     General: No focal deficit present.     Mental Status: He is alert and oriented to person, place, and time.  Psychiatric:        Mood and Affect: Mood normal.        Behavior: Behavior normal.        Thought Content: Thought content normal.     Results: CT a/p personally reviewed- hernia supraumbilical- about 5 cm from the umbilicus, about [id number] wide, containing fat, gallstones  Assessment & Plan:  Randall Allison is a 43 y.o. male with a supraumbilical hernia that is tender and causing some of his pain but I do not think it is causing all of his issues and he has several things going on at this time. He has nausea/vomiting and epigastric pain which could be from gallstones but also could be from gastritis or an ulcer. He denied any RUQ pain or pain with eating. He also has some rectal bleeding that has been going on for a while, and no obvious external hemorrhoids, but potentially has internal hemorrhoids.   -Supraumbilical hernia repair with mesh  -Remaining symptoms should be evaluated by GI to assess with possible EGD and colonoscopy, he may ultimately need his gallbladder out but has no pain with food or in the RUQ so need to rule out ulcer disease etc first    Discussed risk of hernia repair including but not limited to bleeding, infection, recurrence, use of mesh, risk of injury of the bowel, and not helping all of his symptoms including the nausea/vomiting.   Discussed cardiac history with Dr. Wyatt Haste and patient not symptomatic. Do not think we need to get cardiology to see him or ECHO at this time.    All  questions were answered to the satisfaction of the patient and family.  I spent 45 minutes with this patient and his wife discussing the symptoms, the options and the plan of care as detailed above. I also called Dr. Wyatt Haste and sent  Dr. Laural Golden a message.    [name] 11/10/2018, 8:05 AM

## 2018-11-11 ENCOUNTER — Ambulatory Visit (HOSPITAL_COMMUNITY): Payer: Self-pay | Admitting: Anesthesiology

## 2018-11-11 ENCOUNTER — Encounter (HOSPITAL_COMMUNITY): Payer: Self-pay

## 2018-11-11 ENCOUNTER — Other Ambulatory Visit: Payer: Self-pay

## 2018-11-11 ENCOUNTER — Ambulatory Visit (HOSPITAL_COMMUNITY)
Admission: RE | Admit: 2018-11-11 | Discharge: 2018-11-11 | Disposition: A | Discharge status: Home / Self Care | Payer: Self-pay | Attending: General Surgery | Admitting: General Surgery

## 2018-11-11 ENCOUNTER — Encounter (HOSPITAL_COMMUNITY)
Admission: RE | Disposition: A | Discharge status: Home / Self Care | Payer: Self-pay | Source: Home / Self Care | Attending: General Surgery

## 2018-11-11 DIAGNOSIS — K219 Gastro-esophageal reflux disease without esophagitis: Secondary | ICD-10-CM | POA: Insufficient documentation

## 2018-11-11 DIAGNOSIS — K439 Ventral hernia without obstruction or gangrene: Secondary | ICD-10-CM | POA: Insufficient documentation

## 2018-11-11 HISTORY — PX: UMBILICAL HERNIA REPAIR: SHX196

## 2018-11-11 SURGERY — REPAIR, HERNIA, UMBILICAL, ADULT
Anesthesia: General | Site: Abdomen

## 2018-11-11 MED ORDER — SUCCINYLCHOLINE CHLORIDE 200 MG/10ML IV SOSY
PREFILLED_SYRINGE | INTRAVENOUS | Status: AC
  Filled 2018-11-11: qty 10

## 2018-11-11 MED ORDER — MIDAZOLAM HCL 5 MG/5ML IJ SOLN
INTRAMUSCULAR | Status: DC | PRN
  Administered 2018-11-11: 2 mg via INTRAVENOUS

## 2018-11-11 MED ORDER — LIDOCAINE 2% (20 MG/ML) 5 ML SYRINGE
INTRAMUSCULAR | Status: AC
  Filled 2018-11-11: qty 5

## 2018-11-11 MED ORDER — CHLORHEXIDINE GLUCONATE CLOTH 2 % EX PADS: 6.0000 | MEDICATED_PAD | Freq: Once | CUTANEOUS | Status: DC

## 2018-11-11 MED ORDER — SODIUM CHLORIDE 0.9 % IR SOLN
Status: DC | PRN
  Administered 2018-11-11: 1000 mL

## 2018-11-11 MED ORDER — CEFAZOLIN SODIUM-DEXTROSE 2-4 GM/100ML-% IV SOLN
2.0000 g | INTRAVENOUS | Status: AC
  Administered 2018-11-11: 2 g via INTRAVENOUS
  Filled 2018-11-11: qty 100

## 2018-11-11 MED ORDER — DOCUSATE SODIUM 100 MG PO CAPS: 100.0000 mg | ORAL_CAPSULE | Freq: Two times a day (BID) | ORAL | 2 refills | Status: AC

## 2018-11-11 MED ORDER — DEXAMETHASONE SODIUM PHOSPHATE 10 MG/ML IJ SOLN
INTRAMUSCULAR | Status: AC
  Filled 2018-11-11: qty 1

## 2018-11-11 MED ORDER — SUCRALFATE 1 G PO TABS: 1.0000 g | ORAL_TABLET | Freq: Four times a day (QID) | ORAL | 1 refills | Status: DC

## 2018-11-11 MED ORDER — MIDAZOLAM HCL 2 MG/2ML IJ SOLN
INTRAMUSCULAR | Status: AC
  Filled 2018-11-11: qty 2

## 2018-11-11 MED ORDER — FENTANYL CITRATE (PF) 100 MCG/2ML IJ SOLN
INTRAMUSCULAR | Status: DC | PRN
  Administered 2018-11-11 (×2): 25 ug via INTRAVENOUS
  Administered 2018-11-11 (×2): 50 ug via INTRAVENOUS

## 2018-11-11 MED ORDER — OXYCODONE HCL 5 MG PO TABS: 5.0000 mg | ORAL_TABLET | ORAL | 0 refills | Status: DC | PRN

## 2018-11-11 MED ORDER — ROCURONIUM 10MG/ML (10ML) SYRINGE FOR MEDFUSION PUMP - OPTIME
INTRAVENOUS | Status: DC | PRN
  Administered 2018-11-11: 25 mg via INTRAVENOUS

## 2018-11-11 MED ORDER — ROCURONIUM BROMIDE 10 MG/ML (PF) SYRINGE
PREFILLED_SYRINGE | INTRAVENOUS | Status: AC
  Filled 2018-11-11: qty 10

## 2018-11-11 MED ORDER — ONDANSETRON HCL 4 MG/2ML IJ SOLN: 4.0000 mg | Freq: Once | INTRAMUSCULAR | Status: DC | PRN

## 2018-11-11 MED ORDER — LIDOCAINE HCL (CARDIAC) PF 50 MG/5ML IV SOSY
PREFILLED_SYRINGE | INTRAVENOUS | Status: DC | PRN
  Administered 2018-11-11: 40 mg via INTRAVENOUS

## 2018-11-11 MED ORDER — BUPIVACAINE LIPOSOME 1.3 % IJ SUSP
INTRAMUSCULAR | Status: AC
  Filled 2018-11-11: qty 20

## 2018-11-11 MED ORDER — HYDROMORPHONE HCL 1 MG/ML IJ SOLN
0.2500 mg | INTRAMUSCULAR | Status: DC | PRN
  Administered 2018-11-11: 0.5 mg via INTRAVENOUS
  Filled 2018-11-11: qty 0.5

## 2018-11-11 MED ORDER — SUGAMMADEX SODIUM 200 MG/2ML IV SOLN
INTRAVENOUS | Status: DC | PRN
  Administered 2018-11-11: 200 mg via INTRAVENOUS

## 2018-11-11 MED ORDER — PROPOFOL 10 MG/ML IV BOLUS
INTRAVENOUS | Status: DC | PRN
  Administered 2018-11-11: 120 mg via INTRAVENOUS

## 2018-11-11 MED ORDER — SUCCINYLCHOLINE 20MG/ML (10ML) SYRINGE FOR MEDFUSION PUMP - OPTIME
INTRAMUSCULAR | Status: DC | PRN
  Administered 2018-11-11: 100 mg via INTRAVENOUS

## 2018-11-11 MED ORDER — LACTATED RINGERS IV SOLN
Freq: Once | INTRAVENOUS | Status: AC
  Administered 2018-11-11: 11:00:00 via INTRAVENOUS

## 2018-11-11 MED ORDER — FENTANYL CITRATE (PF) 250 MCG/5ML IJ SOLN
INTRAMUSCULAR | Status: AC
  Filled 2018-11-11: qty 5

## 2018-11-11 MED ORDER — DEXAMETHASONE SODIUM PHOSPHATE 10 MG/ML IJ SOLN
INTRAMUSCULAR | Status: DC | PRN
  Administered 2018-11-11: 10 mg via INTRAVENOUS

## 2018-11-11 MED ORDER — PROPOFOL 10 MG/ML IV BOLUS
INTRAVENOUS | Status: AC
  Filled 2018-11-11: qty 40

## 2018-11-11 MED ORDER — OMEPRAZOLE 40 MG PO CPDR: 40.0000 mg | DELAYED_RELEASE_CAPSULE | Freq: Every day | ORAL | 1 refills | Status: DC

## 2018-11-11 MED ORDER — MEPERIDINE HCL 50 MG/ML IJ SOLN: 6.2500 mg | INTRAMUSCULAR | Status: DC | PRN

## 2018-11-11 MED ORDER — BUPIVACAINE LIPOSOME 1.3 % IJ SUSP
INTRAMUSCULAR | Status: DC | PRN
  Administered 2018-11-11: 20 mL

## 2018-11-11 SURGICAL SUPPLY — 33 items
BLADE SURG 15 STRL LF DISP TIS (BLADE) ×1 IMPLANT
BLADE SURG 15 STRL SS (BLADE) ×2
CHLORAPREP W/TINT 26 (MISCELLANEOUS) ×3 IMPLANT
CLOTH BEACON ORANGE TIMEOUT ST (SAFETY) ×3 IMPLANT
COVER LIGHT HANDLE STERIS (MISCELLANEOUS) ×6 IMPLANT
COVER WAND RF STERILE (DRAPES) ×3 IMPLANT
DERMABOND ADVANCED (GAUZE/BANDAGES/DRESSINGS) ×2
DERMABOND ADVANCED .7 DNX12 (GAUZE/BANDAGES/DRESSINGS) ×1 IMPLANT
ELECT REM PT RETURN 9FT ADLT (ELECTROSURGICAL) ×3
ELECTRODE REM PT RTRN 9FT ADLT (ELECTROSURGICAL) ×1 IMPLANT
GLOVE BIO SURGEON STRL SZ 6.5 (GLOVE) ×2 IMPLANT
GLOVE BIO SURGEONS STRL SZ 6.5 (GLOVE) ×1
GLOVE BIOGEL PI IND STRL 6.5 (GLOVE) ×1 IMPLANT
GLOVE BIOGEL PI IND STRL 7.0 (GLOVE) ×1 IMPLANT
GLOVE BIOGEL PI INDICATOR 6.5 (GLOVE) ×2
GLOVE BIOGEL PI INDICATOR 7.0 (GLOVE) ×2
GLOVE ECLIPSE 6.5 STRL STRAW (GLOVE) ×2 IMPLANT
GOWN STRL REUS W/ TWL LRG LVL3 (GOWN DISPOSABLE) ×1 IMPLANT
GOWN STRL REUS W/TWL LRG LVL3 (GOWN DISPOSABLE) ×7 IMPLANT
INST SET MINOR GENERAL (KITS) ×3 IMPLANT
KIT TURNOVER KIT A (KITS) ×3 IMPLANT
MANIFOLD NEPTUNE II (INSTRUMENTS) ×3 IMPLANT
MESH VENTRALEX ST 1-7/10 CRC S (Mesh General) ×2 IMPLANT
NEEDLE HYPO 22GX1.5 SAFETY (NEEDLE) ×3 IMPLANT
NS IRRIG 1000ML POUR BTL (IV SOLUTION) ×3 IMPLANT
PACK MINOR (CUSTOM PROCEDURE TRAY) ×3 IMPLANT
PAD ARMBOARD 7.5X6 YLW CONV (MISCELLANEOUS) ×3 IMPLANT
SET BASIN LINEN APH (SET/KITS/TRAYS/PACK) ×3 IMPLANT
SUT ETHIBOND NAB MO 7 #0 18IN (SUTURE) ×3 IMPLANT
SUT MNCRL AB 4-0 PS2 18 (SUTURE) ×3 IMPLANT
SUT VIC AB 3-0 SH 27 (SUTURE) ×2
SUT VIC AB 3-0 SH 27X BRD (SUTURE) ×1 IMPLANT
SYR 20ML LL LF (SYRINGE) ×3 IMPLANT

## 2018-11-11 NOTE — Anesthesia Procedure Notes (Signed)
Procedure Name: Intubation Date/Time: 11/11/2018 11:41 AM Performed by: Ollen Bowl, CRNA Pre-anesthesia Checklist: Patient identified, Patient being monitored, Timeout performed, Emergency Drugs available and Suction available Patient Re-evaluated:Patient Re-evaluated prior to induction Oxygen Delivery Method: Circle system utilized Preoxygenation: Pre-oxygenation with 100% oxygen Induction Type: IV induction Ventilation: Mask ventilation without difficulty Laryngoscope Size: Mac and 3 Grade View: Grade I Tube type: Oral Tube size: 7.0 mm Number of attempts: 1 Airway Equipment and Method: Stylet Placement Confirmation: ETT inserted through vocal cords under direct vision,  positive ETCO2 and breath sounds checked- equal and bilateral Secured at: 21 cm Tube secured with: Tape Dental Injury: Teeth and Oropharynx as per pre-operative assessment

## 2018-11-11 NOTE — Transfer of Care (Signed)
Immediate Anesthesia Transfer of Care Note  Patient: Randall Allison  Procedure(s) Performed: HERNIA REPAIR SUPRAUMBILICAL ADULT (N/A Abdomen)  Patient Location: PACU  Anesthesia Type:General  Level of Consciousness: awake and patient cooperative  Airway & Oxygen Therapy: Patient Spontanous Breathing  Post-op Assessment: Report given to RN and Post -op Vital signs reviewed and stable  Post vital signs: Reviewed and stable  Last Vitals:  Vitals Value Taken Time  BP    Temp    Pulse 78 11/11/18 1238  Resp    SpO2 94 % 11/11/18 1238  Vitals shown include unvalidated device data.  Last Pain:  Vitals:   11/11/18 1109  TempSrc: Oral  PainSc: 0-No pain      Patients Stated Pain Goal: 6 ([id number])  Complications: No apparent anesthesia complications  SEE PACU FLOWSHEET FOR VITAL SIGNS

## 2018-11-11 NOTE — Progress Notes (Signed)
[facility]  Talked to Wife Colletta [place] after surgery. Patient should not lift and should wear the abdominal binder. Recommended PPI use and carafate use to help with nausea/vomiting as I am afraid the patient may have gastritis or an ulcer due to his Goodie's powder use. Told patient to stop Goodie's powder.  Post op phone call 11/12 unless he needs to be seen. Have referred to GI for possible endoscopy.  Curlene Labrum, MD [facility] [address] [place], [place] [address] [id number] [phone] (office)

## 2018-11-11 NOTE — Interval H&P Note (Signed)
History and Physical Interval Note:  11/11/2018 11:28 AM  Zara Council  has presented today for surgery, with the diagnosis of SUPRAUMBILICAL HERNIA.  The various methods of treatment have been discussed with the patient and family. After consideration of risks, benefits and other options for treatment, the patient has consented to  Procedure(s): HERNIA REPAIR UMBILICAL ADULT (N/A) as a surgical intervention.  The patient's history has been reviewed, patient examined, no change in status, stable for surgery.  I have reviewed the patient's chart and labs.  Questions were answered to the patient's satisfaction.    Hernia repair. No changes. 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 He wears compression hose. He says that he will cut back. Will prescribe PPI to help and carafate.  [name]

## 2018-11-11 NOTE — Anesthesia Postprocedure Evaluation (Signed)
Anesthesia Post Note  Patient: Ansen Ocasio  Procedure(s) Performed: HERNIA REPAIR SUPRAUMBILICAL ADULT (N/A Abdomen)  Patient location during evaluation: PACU Anesthesia Type: General Level of consciousness: awake and alert and oriented Pain management: pain level controlled Vital Signs Assessment: post-procedure vital signs reviewed and stable Respiratory status: spontaneous breathing Cardiovascular status: blood pressure returned to baseline and stable Postop Assessment: no apparent nausea or vomiting Anesthetic complications: no     Last Vitals:  Vitals:   11/11/18 1315 11/11/18 1325  BP: (!) 131/95 130/87  Pulse: 65 64  Resp: 18 17  Temp:  36.5 C  SpO2: 92% 94%    Last Pain:  Vitals:   11/11/18 1325  TempSrc: Oral  PainSc: 3                  Vivian Okelley

## 2018-11-11 NOTE — Anesthesia Preprocedure Evaluation (Addendum)
Anesthesia Evaluation  Patient identified by MRN, date of birth, ID band Patient awake    Reviewed: Allergy & Precautions, NPO status , Patient's Chart, lab work & pertinent test results  Airway Mallampati: II  TM Distance: >3 FB Neck ROM: Full    Dental  (+) Chipped, Missing, Dental Advisory Given   Pulmonary neg pulmonary ROS,    breath sounds clear to auscultation       Cardiovascular Exercise Tolerance: Good  Rhythm:Regular Rate:Normal + Systolic murmurs VSD Repair   Neuro/Psych negative neurological ROS  negative psych ROS   GI/Hepatic GERD (mild, not on meds)  ,  Endo/Other    Renal/GU Renal disease     Musculoskeletal negative musculoskeletal ROS (+)   Abdominal   Peds  Hematology   Anesthesia Other Findings   Reproductive/Obstetrics negative OB ROS                            Anesthesia Physical Anesthesia Plan  ASA: II  Anesthesia Plan: General   Post-op Pain Management:    Induction: Intravenous  PONV Risk Score and Plan: 3 and Ondansetron, Dexamethasone and Midazolam  Airway Management Planned: Oral ETT  Additional Equipment:   Intra-op Plan:   Post-operative Plan: Extubation in OR  Informed Consent: I have reviewed the patients History and Physical, chart, labs and discussed the procedure including the risks, benefits and alternatives for the proposed anesthesia with the patient or authorized representative who has indicated his/her understanding and acceptance.     Dental advisory given  Plan Discussed with: CRNA  Anesthesia Plan Comments:         Anesthesia Quick Evaluation

## 2018-11-11 NOTE — Op Note (Signed)
[facility] Operative Note  11/11/18  Preoperative Diagnosis: Supraumbilical hernia    Postoperative Diagnosis: Same   Procedure(s) Performed:  Open hernia repair with mesh (4.3cm Ventralex ST Patch)    Surgeon: Lanell Matar. Constance Haw, MD   Assistants: No qualified resident was available    Anesthesia: General endotracheal   Anesthesiologist: Denese Killings, MD    Specimens: None   Estimated Blood Loss: Minimal   Blood Replacement: None    Complications: None   Wound Class: Clean    Operative Indications: Mr.Burley is a 43 yo with a supraumbilical hernia that has been causing him pain. He also has some nausea and vomiting but I told him I did not think this was related to his hernia as it only contains fat. He also takes Goodie's Powder so we discussed stopping these and starting a PPI and Carafate. We discussed the hernia repair and the risk of bleeding, infection, injury to bowel, use of mesh and risk of recurrence. He opted to proceed with repair and has been referred to GI for further evaluation.   Findings: 1.5cm hernia defect in the supraumbilical area with fat    Procedure: The patient was taken to the operating room and placed supine. General endotracheal anesthesia was induced. Intravenous antibiotics were  administered per protocol.  The abdomen was prepared and draped in the usual sterile fashion.   The supraumbilical hernia was noted to be reducible and measured about 1.5cm. An incision was made in the midline over the hernia, and carried down through the subcutaneous tissue with electrocautery.  Dissection was performed down to the level of the fascia, exposing the hernia sac.  The hernia sac was opened with care, and excess hernia sac was resected with electrocautery.  A finger was ran on the underlying peritoneum and this was clear.  A 4.3 cm Ventralex St Hernia Patch was placed and secured with 0 Ethibond sutures ensuring that it was against the  peritoneal cavity.  The hernia defect was then closed with 0 Ethibond suture in an interrupted fashion over the patch.  The deep tissue was closed with 3-0 Vicryl suture.   Hemostasis was confirmed. The skin was closed with a running 4-0 Monocryl suture and dermabond.   All counts were correct at the end of the case. The patient was awakened from anesthesia and extubated without complication.  The patient went to the PACU in stable condition.   Curlene Labrum, MD [facility] [address] [place],  [id number] [phone] (office)

## 2018-11-11 NOTE — Discharge Instructions (Signed)
Discharge Instructions Hernia:  Common Complaints: Pain at the incision site is common. This will improve with time. Take your pain medications as described below. Some nausea is common and poor appetite. The main goal is to stay hydrated the first few days after surgery.  Do not return to work until you speak with Dr. Constance Haw on 11/24/2018.   Diet/ Activity: Diet as tolerated. You may not have an appetite, but it is important to stay hydrated. Drink 64 ounces of water a day. Your appetite will return with time.  Shower per your regular routine daily.  Do not take hot showers. Take warm showers that are less than 10 minutes. Rest and listen to your body, but do not remain in bed all day.Walk everyday for at least 15-20 minutes.  Deep cough and move around every 1-2 hours in the first few days after surgery. Do not pick at the dermabond glue on your incision sites.  This glue film will remain in place for 1-2 weeks and will start to peel off. Do not place lotions or balms on your incision unless instructed to specifically by Dr. Constance Haw. Do not lift > 10 lbs, perform excessive bending, pushing, pulling, squatting for 6-8 weeks after surgery. Where your abdominal binder with activity as much as possible. The activity restrictions and the abdominal binder are to prevent hernia formation at your incision while you are healing.   Medication: Take tylenol and ibuprofen as needed for pain control, alternating every 4-6 hours.  Example:  Tylenol 1000mg  @ 6am, 12noon, 6pm, 88midnight (Do not exceed 4000mg  of tylenol a day). Ibuprofen 800mg  @ 9am, 3pm, 9pm, 3am (Do not exceed 3600mg  of ibuprofen a day).  Take Roxicodone for breakthrough pain every 4 hours.  Take Colace for constipation related to narcotic pain medication. If you do not have a bowel movement in 2 days, take Miralax over the counter.  Drink plenty of water to also prevent constipation.   Contact Information: If you have questions or  concerns, please call our office, [phone], Monday- Thursday 8AM-5PM and Friday 8AM-12Noon.  If it is after hours or on the weekend, please call [facility] Main [facility], [phone], and ask to speak to the surgeon on call for Dr. Constance Haw at [facility].     Open Hernia Repair, Adult, Care After These instructions give you information about caring for yourself after your procedure. Your doctor may also give you more specific instructions. If you have problems or questions, contact your doctor. Follow these instructions at home: Surgical cut (incision) care   Follow instructions from your doctor about how to take care of your surgical cut area. Make sure you: ? Wash your hands with soap and water before you change your bandage (dressing). If you cannot use soap and water, use hand sanitizer. ? Change your bandage as told by your doctor. ? Leave stitches (sutures), skin glue, or skin tape (adhesive) strips in place. They may need to stay in place for 2 weeks or longer. If tape strips get loose and curl up, you may trim the loose edges. Do not remove tape strips completely unless your doctor says it is okay.  Check your surgical cut every day for signs of infection. Check for: ? More redness, swelling, or pain. ? More fluid or blood. ? Warmth. ? Pus or a bad smell. Activity  Do not drive or use heavy machinery while taking prescription pain medicine. Do not drive until your doctor says it is okay.  Until your doctor  says it is okay: ? Do not lift anything that is heavier than 10 lb (4.5 kg). ? Do not play contact sports.  Return to your normal activities as told by your doctor. Ask your doctor what activities are safe. General instructions  To prevent or treat having a hard time pooping (constipation) while you are taking prescription pain medicine, your doctor may recommend that you: ? Drink enough fluid to keep your pee (urine) clear or pale yellow. ? Take over-the-counter or  prescription medicines. ? Eat foods that are high in fiber, such as fresh fruits and vegetables, whole grains, and beans. ? Limit foods that are high in fat and processed sugars, such as fried and sweet foods.  Take over-the-counter and prescription medicines only as told by your doctor.  Do not take baths, swim, or use a hot tub until your doctor says it is okay.  Keep all follow-up visits as told by your doctor. This is important. Contact a doctor if:  You develop a rash.  You have more redness, swelling, or pain around your surgical cut.  You have more fluid or blood coming from your surgical cut.  Your surgical cut feels warm to the touch.  You have pus or a bad smell coming from your surgical cut.  You have a fever or chills.  You have blood in your poop (stool).  You have not pooped in 2-3 days.  Medicine does not help your pain. Get help right away if:  You have chest pain or you are short of breath.  You feel light-headed.  You feel weak and dizzy (feel faint).  You have very bad pain.  You throw up (vomit) and your pain is worse. This information is not intended to replace advice given to you by your health care provider. Make sure you discuss any questions you have with your health care provider. Document Released: 01/19/2014 Document Revised: 04/22/2018 Document Reviewed: 06/12/2015 Elsevier Patient Education  2020 [place].   Gastritis, Adult Gastritis is inflammation of the stomach. There are two kinds of gastritis:  Acute gastritis. This kind develops suddenly.  Chronic gastritis. This kind is much more common and lasts for a long time. Gastritis happens when the lining of the stomach becomes weak or gets damaged. Without treatment, gastritis can lead to stomach bleeding and ulcers. What are the causes? This condition may be caused by:  An infection.  Drinking too much alcohol.  Certain medicines. These include steroids, antibiotics, and  some over-the-counter medicines, such as aspirin or ibuprofen.  Having too much acid in the stomach.  A disease of the intestines or stomach.  Stress.  An allergic reaction.  Crohn's disease.  Some cancer treatments (radiation). Sometimes the cause of this condition is not known. What are the signs or symptoms? Symptoms of this condition include:  Pain or a burning sensation in the upper abdomen.  Nausea.  Vomiting.  An uncomfortable feeling of fullness after eating.  Weight loss.  Bad breath.  Blood in your vomit or stools. In some cases, there are no symptoms. How is this diagnosed? This condition may be diagnosed with:  Your medical history and a description of your symptoms.  A physical exam.  Tests. These can include: ? Blood tests. ? Stool tests. ? A test in which a thin, flexible instrument with a light and a camera is passed down the esophagus and into the stomach (upper endoscopy). ? A test in which a sample of tissue is taken for  testing (biopsy). How is this treated? This condition may be treated with medicines. The medicines that are used vary depending on the cause of the gastritis:  If the condition is caused by a bacterial infection, you may be given antibiotic medicines.  If the condition is caused by too much acid in the stomach, you may be given medicines called H2 blockers, proton pump inhibitors, or antacids. Treatment may also involve stopping the use of certain medicines, such as aspirin, ibuprofen, or other NSAIDs. Follow these instructions at home: Medicines  Take over-the-counter and prescription medicines only as told by your health care provider.  If you were prescribed an antibiotic medicine, take it as told by your health care provider. Do not stop taking the antibiotic even if you start to feel better. Eating and drinking   Eat small, frequent meals instead of large meals.  Avoid foods and drinks that make your symptoms  worse.  Drink enough fluid to keep your urine pale yellow. Alcohol use  Do not drink alcohol if: ? Your health care provider tells you not to drink. ? You are pregnant, may be pregnant, or are planning to become pregnant.  If you drink alcohol: ? Limit your use to:  0-1 drink a day for women.  0-2 drinks a day for men. ? Be aware of how much alcohol is in your drink. In the [place] one drink equals one 12 oz bottle of beer (355 mL), one 5 oz glass of wine (148 mL), or one 1 oz glass of hard liquor (44 mL). General instructions  Talk with your health care provider about ways to manage stress, such as getting regular exercise or practicing deep breathing, meditation, or yoga.  Do not use any products that contain nicotine or tobacco, such as cigarettes and e-cigarettes. If you need help quitting, ask your health care provider.  Keep all follow-up visits as told by your health care provider. This is important. Contact a health care provider if:  Your symptoms get worse.  Your symptoms return after treatment. Get help right away if:  You vomit blood or material that looks like coffee grounds.  You have black or dark red stools.  You are unable to keep fluids down.  Your abdominal pain gets worse.  You have a fever.  You do not feel better after one week. Summary  Gastritis is inflammation of the lining of the stomach that can occur suddenly (acute) or develop slowly over time (chronic).  This condition is diagnosed with a medical history, a physical exam, or tests.  This condition may be treated with medicines to treat infection or medicines to reduce the amount of acid in your stomach.  Follow your health care provider's instructions about taking medicines, making changes to your diet, and knowing when to call for help. This information is not intended to replace advice given to you by your health care provider. Make sure you discuss any questions you have with your  health care provider. Document Released: 12/23/2000 Document Revised: 05/18/2017 Document Reviewed: 05/18/2017 Elsevier Patient Education  2020 [place] Anesthesia, Adult, Care After This sheet gives you information about how to care for yourself after your procedure. Your health care provider may also give you more specific instructions. If you have problems or questions, contact your health care provider. What can I expect after the procedure? After the procedure, the following side effects are common:  Pain or discomfort at the IV site.  Nausea.  Vomiting.  Sore throat.  Trouble concentrating.  Feeling cold or chills.  Weak or tired.  Sleepiness and fatigue.  Soreness and body aches. These side effects can affect parts of the body that were not involved in surgery. Follow these instructions at home:  For at least 24 hours after the procedure:  Have a responsible adult stay with you. It is important to have someone help care for you until you are awake and alert.  Rest as needed.  Do not: ? Participate in activities in which you could fall or become injured. ? Drive. ? Use heavy machinery. ? Drink alcohol. ? Take sleeping pills or medicines that cause drowsiness. ? Make important decisions or sign legal documents. ? Take care of children on your own. Eating and drinking  Follow any instructions from your health care provider about eating or drinking restrictions.  When you feel hungry, start by eating small amounts of foods that are soft and easy to digest (bland), such as toast. Gradually return to your regular diet.  Drink enough fluid to keep your urine pale yellow.  If you vomit, rehydrate by drinking water, juice, or clear broth. General instructions  If you have sleep apnea, surgery and certain medicines can increase your risk for breathing problems. Follow instructions from your health care provider about wearing your sleep  device: ? Anytime you are sleeping, including during daytime naps. ? While taking prescription pain medicines, sleeping medicines, or medicines that make you drowsy.  Return to your normal activities as told by your health care provider. Ask your health care provider what activities are safe for you.  Take over-the-counter and prescription medicines only as told by your health care provider.  If you smoke, do not smoke without supervision.  Keep all follow-up visits as told by your health care provider. This is important. Contact a health care provider if:  You have nausea or vomiting that does not get better with medicine.  You cannot eat or drink without vomiting.  You have pain that does not get better with medicine.  You are unable to pass urine.  You develop a skin rash.  You have a fever.  You have redness around your IV site that gets worse. Get help right away if:  You have difficulty breathing.  You have chest pain.  You have blood in your urine or stool, or you vomit blood. Summary  After the procedure, it is common to have a sore throat or nausea. It is also common to feel tired.  Have a responsible adult stay with you for the first 24 hours after general anesthesia. It is important to have someone help care for you until you are awake and alert.  When you feel hungry, start by eating small amounts of foods that are soft and easy to digest (bland), such as toast. Gradually return to your regular diet.  Drink enough fluid to keep your urine pale yellow.  Return to your normal activities as told by your health care provider. Ask your health care provider what activities are safe for you. This information is not intended to replace advice given to you by your health care provider. Make sure you discuss any questions you have with your health care provider. Document Released: 04/06/2000 Document Revised: 01/01/2017 Document Reviewed: 08/14/2016 Elsevier Patient  Education  2020 [employer].

## 2018-11-14 ENCOUNTER — Encounter (HOSPITAL_COMMUNITY): Payer: Self-pay | Admitting: General Surgery

## 2018-11-15 ENCOUNTER — Telehealth (INDEPENDENT_AMBULATORY_CARE_PROVIDER_SITE_OTHER): Payer: Self-pay | Admitting: Internal Medicine

## 2018-11-15 ENCOUNTER — Encounter: Payer: Self-pay | Admitting: General Surgery

## 2018-11-15 NOTE — Telephone Encounter (Signed)
Dr Laural Golden wants to see this patient but let him know he doesn't have anything on Mondays or Tuesdays until March - he may want to see him on a Thursday - please advise

## 2018-11-17 NOTE — Telephone Encounter (Signed)
Dr.Rehman states that he will see the patient on a Thursday.

## 2018-11-22 ENCOUNTER — Telehealth (INDEPENDENT_AMBULATORY_CARE_PROVIDER_SITE_OTHER): Payer: Self-pay | Admitting: General Surgery

## 2018-11-22 ENCOUNTER — Other Ambulatory Visit: Payer: Self-pay

## 2018-11-22 DIAGNOSIS — R112 Nausea with vomiting, unspecified: Secondary | ICD-10-CM

## 2018-11-22 DIAGNOSIS — K439 Ventral hernia without obstruction or gangrene: Secondary | ICD-10-CM
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
## 2018-11-24 ENCOUNTER — Encounter (INDEPENDENT_AMBULATORY_CARE_PROVIDER_SITE_OTHER): Payer: Self-pay | Admitting: Internal Medicine

## 2018-11-24 ENCOUNTER — Ambulatory Visit (INDEPENDENT_AMBULATORY_CARE_PROVIDER_SITE_OTHER): Payer: Self-pay | Admitting: Internal Medicine

## 2018-11-24 ENCOUNTER — Other Ambulatory Visit: Payer: Self-pay

## 2018-11-24 VITALS — BP 107/63 | HR 89 | Temp 97.2°F | Ht 69.0 in | Wt 164.6 lb

## 2018-11-24 DIAGNOSIS — K589 Irritable bowel syndrome without diarrhea: Secondary | ICD-10-CM

## 2018-11-24 DIAGNOSIS — K219 Gastro-esophageal reflux disease without esophagitis: Secondary | ICD-10-CM | POA: Insufficient documentation

## 2018-11-24 DIAGNOSIS — K625 Hemorrhage of anus and rectum: Secondary | ICD-10-CM

## 2018-11-24 DIAGNOSIS — K8020 Calculus of gallbladder without cholecystitis without obstruction: Secondary | ICD-10-CM

## 2018-11-24 MED ORDER — DICYCLOMINE HCL 10 MG PO CAPS: 10.0000 mg | ORAL_CAPSULE | Freq: Three times a day (TID) | ORAL | 2 refills | Status: DC | PRN

## 2018-11-24 MED ORDER — BENEFIBER DRINK MIX PO PACK: 4.0000 g | PACK | Freq: Every day | ORAL | Status: DC

## 2018-11-24 NOTE — Patient Instructions (Signed)
Can use dicyclomine on as-needed basis or take 10 mg every morning and see how you do. Keep symptom diary as to frequency of episodic abdominal pain and rectal bleeding. Colonoscopy to be scheduled.
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
## 2018-11-25 ENCOUNTER — Other Ambulatory Visit (INDEPENDENT_AMBULATORY_CARE_PROVIDER_SITE_OTHER): Payer: Self-pay | Admitting: *Deleted

## 2018-11-25 ENCOUNTER — Encounter (INDEPENDENT_AMBULATORY_CARE_PROVIDER_SITE_OTHER): Payer: Self-pay | Admitting: *Deleted

## 2018-11-25 DIAGNOSIS — K625 Hemorrhage of anus and rectum: Secondary | ICD-10-CM

## 2018-11-25 DIAGNOSIS — K8020 Calculus of gallbladder without cholecystitis without obstruction: Secondary | ICD-10-CM

## 2018-12-27 ENCOUNTER — Encounter (INDEPENDENT_AMBULATORY_CARE_PROVIDER_SITE_OTHER): Payer: Self-pay | Admitting: *Deleted

## 2019-01-02 ENCOUNTER — Other Ambulatory Visit: Payer: Self-pay

## 2019-01-02 ENCOUNTER — Other Ambulatory Visit (HOSPITAL_COMMUNITY)
Admission: RE | Admit: 2019-01-02 | Discharge: 2019-01-02 | Disposition: A | Discharge status: Home / Self Care | Payer: Self-pay | Source: Ambulatory Visit | Attending: Internal Medicine | Admitting: Internal Medicine

## 2019-01-02 ENCOUNTER — Other Ambulatory Visit (HOSPITAL_COMMUNITY): Payer: Self-pay

## 2019-01-02 DIAGNOSIS — Z01812 Encounter for preprocedural laboratory examination: Secondary | ICD-10-CM | POA: Insufficient documentation

## 2019-01-02 DIAGNOSIS — Z20828 Contact with and (suspected) exposure to other viral communicable diseases: Secondary | ICD-10-CM | POA: Insufficient documentation

## 2019-01-02 LAB — SARS CORONAVIRUS 2 (TAT 6-24 HRS): SARS Coronavirus 2: NEGATIVE

## 2019-01-03 ENCOUNTER — Encounter (INDEPENDENT_AMBULATORY_CARE_PROVIDER_SITE_OTHER): Payer: Self-pay

## 2019-01-04 ENCOUNTER — Other Ambulatory Visit: Payer: Self-pay

## 2019-01-04 ENCOUNTER — Encounter (HOSPITAL_COMMUNITY)
Admission: RE | Disposition: A | Discharge status: Home / Self Care | Payer: Self-pay | Source: Home / Self Care | Attending: Internal Medicine

## 2019-01-04 ENCOUNTER — Encounter (HOSPITAL_COMMUNITY): Payer: Self-pay | Admitting: Internal Medicine

## 2019-01-04 ENCOUNTER — Ambulatory Visit (HOSPITAL_COMMUNITY)
Admission: RE | Admit: 2019-01-04 | Discharge: 2019-01-04 | Disposition: A | Discharge status: Home / Self Care | Payer: Self-pay | Attending: Internal Medicine | Admitting: Internal Medicine

## 2019-01-04 DIAGNOSIS — K625 Hemorrhage of anus and rectum: Secondary | ICD-10-CM

## 2019-01-04 DIAGNOSIS — K449 Diaphragmatic hernia without obstruction or gangrene: Secondary | ICD-10-CM | POA: Insufficient documentation

## 2019-01-04 DIAGNOSIS — R12 Heartburn: Secondary | ICD-10-CM

## 2019-01-04 DIAGNOSIS — K2100 Gastro-esophageal reflux disease with esophagitis, without bleeding: Secondary | ICD-10-CM

## 2019-01-04 DIAGNOSIS — D125 Benign neoplasm of sigmoid colon: Secondary | ICD-10-CM

## 2019-01-04 DIAGNOSIS — R112 Nausea with vomiting, unspecified: Secondary | ICD-10-CM | POA: Insufficient documentation

## 2019-01-04 DIAGNOSIS — K648 Other hemorrhoids: Secondary | ICD-10-CM

## 2019-01-04 DIAGNOSIS — K635 Polyp of colon: Secondary | ICD-10-CM | POA: Insufficient documentation

## 2019-01-04 DIAGNOSIS — K5730 Diverticulosis of large intestine without perforation or abscess without bleeding: Secondary | ICD-10-CM | POA: Insufficient documentation

## 2019-01-04 DIAGNOSIS — K8020 Calculus of gallbladder without cholecystitis without obstruction: Secondary | ICD-10-CM | POA: Insufficient documentation

## 2019-01-04 HISTORY — PX: POLYPECTOMY: SHX5525

## 2019-01-04 HISTORY — PX: COLONOSCOPY: SHX5424

## 2019-01-04 HISTORY — PX: ESOPHAGOGASTRODUODENOSCOPY: SHX5428

## 2019-01-04 SURGERY — COLONOSCOPY
Anesthesia: Moderate Sedation

## 2019-01-04 MED ORDER — MIDAZOLAM HCL 5 MG/5ML IJ SOLN
INTRAMUSCULAR | Status: AC
  Filled 2019-01-04: qty 10

## 2019-01-04 MED ORDER — MIDAZOLAM HCL 5 MG/5ML IJ SOLN
INTRAMUSCULAR | Status: DC | PRN
  Administered 2019-01-04 (×2): 2 mg via INTRAVENOUS
  Administered 2019-01-04 (×2): 1 mg via INTRAVENOUS
  Administered 2019-01-04: 2 mg via INTRAVENOUS

## 2019-01-04 MED ORDER — MEPERIDINE HCL 50 MG/ML IJ SOLN
INTRAMUSCULAR | Status: AC
  Filled 2019-01-04: qty 1

## 2019-01-04 MED ORDER — LIDOCAINE VISCOUS HCL 2 % MT SOLN
OROMUCOSAL | Status: DC | PRN
  Administered 2019-01-04: 4 mL via OROMUCOSAL

## 2019-01-04 MED ORDER — STERILE WATER FOR IRRIGATION IR SOLN
Status: DC | PRN
  Administered 2019-01-04: 13:00:00 1.5 mL

## 2019-01-04 MED ORDER — BENEFIBER DRINK MIX PO PACK: 4.0000 g | PACK | Freq: Every day | ORAL | Status: DC

## 2019-01-04 MED ORDER — MEPERIDINE HCL 50 MG/ML IJ SOLN
INTRAMUSCULAR | Status: DC | PRN
  Administered 2019-01-04 (×3): 25 mg via INTRAVENOUS

## 2019-01-04 MED ORDER — SODIUM CHLORIDE 0.9 % IV SOLN: INTRAVENOUS | Status: DC

## 2019-01-04 MED ORDER — LIDOCAINE VISCOUS HCL 2 % MT SOLN
OROMUCOSAL | Status: AC
  Filled 2019-01-04: qty 15

## 2019-01-04 NOTE — Discharge Instructions (Signed)
No aspirin or NSAIDs for 24 hours. Take omeprazole 40 mg by mouth 30 minutes before breakfast daily. Resume other medications as before. Benefiber 4 g by mouth daily at bedtime. High-fiber diet. No driving for 24 hours. Physician will call with biopsy results.     Upper Endoscopy, Adult, Care After This sheet gives you information about how to care for yourself after your procedure. Your health care provider may also give you more specific instructions. If you have problems or questions, contact your health care provider. What can I expect after the procedure? After the procedure, it is common to have:  A sore throat.  Mild stomach pain or discomfort.  Bloating.  Nausea. Follow these instructions at home:   Follow instructions from your health care provider about what to eat or drink after your procedure.  Return to your normal activities as told by your health care provider. Ask your health care provider what activities are safe for you.  Take over-the-counter and prescription medicines only as told by your health care provider.  Do not drive for 24 hours if you were given a sedative during your procedure.  Keep all follow-up visits as told by your health care provider. This is important. Contact a health care provider if you have:  A sore throat that lasts longer than one day.  Trouble swallowing. Get help right away if:  You vomit blood or your vomit looks like coffee grounds.  You have: ? A fever. ? Bloody, black, or tarry stools. ? A severe sore throat or you cannot swallow. ? Difficulty breathing. ? Severe pain in your chest or abdomen. Summary  After the procedure, it is common to have a sore throat, mild stomach discomfort, bloating, and nausea.  Do not drive for 24 hours if you were given a sedative during the procedure.  Follow instructions from your health care provider about what to eat or drink after your procedure.  Return to your normal  activities as told by your health care provider. This information is not intended to replace advice given to you by your health care provider. Make sure you discuss any questions you have with your health care provider. Document Released: 06/30/2011 Document Revised: 06/22/2017 Document Reviewed: 05/31/2017 Elsevier Patient Education  2020 [employer].    Colonoscopy, Adult, Care After This sheet gives you information about how to care for yourself after your procedure. Your doctor may also give you more specific instructions. If you have problems or questions, call your doctor. What can I expect after the procedure? After the procedure, it is common to have:  A small amount of blood in your poop for 24 hours.  Some gas.  Mild cramping or bloating in your belly. Follow these instructions at home: General instructions  For the first 24 hours after the procedure: ? Do not drive or use machinery. ? Do not sign important documents. ? Do not drink alcohol. ? Do your daily activities more slowly than normal. ? Eat foods that are soft and easy to digest.  Take over-the-counter or prescription medicines only as told by your doctor. To help cramping and bloating:   Try walking around.  Put heat on your belly (abdomen) as told by your doctor. Use a heat source that your doctor recommends, such as a moist heat pack or a heating pad. ? Put a towel between your skin and the heat source. ? Leave the heat on for 20-30 minutes. ? Remove the heat if your skin turns bright  red. This is especially important if you cannot feel pain, heat, or cold. You can get burned. Eating and drinking   Drink enough fluid to keep your pee (urine) clear or pale yellow.  Return to your normal diet as told by your doctor. Avoid heavy or fried foods that are hard to digest.  Avoid drinking alcohol for as long as told by your doctor. Contact a doctor if:  You have blood in your poop (stool) 2-3 days after  the procedure. Get help right away if:  You have more than a small amount of blood in your poop.  You see large clumps of tissue (blood clots) in your poop.  Your belly is swollen.  You feel sick to your stomach (nauseous).  You throw up (vomit).  You have a fever.  You have belly pain that gets worse, and medicine does not help your pain. Summary  After the procedure, it is common to have a small amount of blood in your poop. You may also have mild cramping and bloating in your belly.  For the first 24 hours after the procedure, do not drive or use machinery, do not sign important documents, and do not drink alcohol.  Get help right away if you have a lot of blood in your poop, feel sick to your stomach, have a fever, or have more belly pain. This information is not intended to replace advice given to you by your health care provider. Make sure you discuss any questions you have with your health care provider. Document Released: 01/31/2010 Document Revised: 10/29/2016 Document Reviewed: 09/23/2015 Elsevier Patient Education  2020 [employer].     Diverticulosis  Diverticulosis is a condition that develops when small pouches (diverticula) form in the wall of the large intestine (colon). The colon is where water is absorbed and stool is formed. The pouches form when the inside layer of the colon pushes through weak spots in the outer layers of the colon. You may have a few pouches or many of them. What are the causes? The cause of this condition is not known. What increases the risk? The following factors may make you more likely to develop this condition:  Being older than age 17. Your risk for this condition increases with age. Diverticulosis is rare among people younger than age 22. By age 81, many people have it.  Eating a low-fiber diet.  Having frequent constipation.  Being overweight.  Not getting enough exercise.  Smoking.  Taking over-the-counter pain  medicines, like aspirin and ibuprofen.  Having a family history of diverticulosis. What are the signs or symptoms? In most people, there are no symptoms of this condition. If you do have symptoms, they may include:  Bloating.  Cramps in the abdomen.  Constipation or diarrhea.  Pain in the lower left side of the abdomen. How is this diagnosed? This condition is most often diagnosed during an exam for other colon problems. Because diverticulosis usually has no symptoms, it often cannot be diagnosed independently. This condition may be diagnosed by:  Using a flexible scope to examine the colon (colonoscopy).  Taking an X-ray of the colon after dye has been put into the colon (barium enema).  Doing a CT scan. How is this treated? You may not need treatment for this condition if you have never developed an infection related to diverticulosis. If you have had an infection before, treatment may include:  Eating a high-fiber diet. This may include eating more fruits, vegetables, and grains.  Taking a fiber supplement.  Taking a live bacteria supplement (probiotic).  Taking medicine to relax your colon.  Taking antibiotic medicines. Follow these instructions at home:  Drink 6-8 glasses of water or more each day to prevent constipation.  Try not to strain when you have a bowel movement.  If you have had an infection before: ? Eat more fiber as directed by your health care provider or your diet and nutrition specialist (dietitian). ? Take a fiber supplement or probiotic, if your health care provider approves.  Take over-the-counter and prescription medicines only as told by your health care provider.  If you were prescribed an antibiotic, take it as told by your health care provider. Do not stop taking the antibiotic even if you start to feel better.  Keep all follow-up visits as told by your health care provider. This is important. Contact a health care provider if:  You have  pain in your abdomen.  You have bloating.  You have cramps.  You have not had a bowel movement in 3 days. Get help right away if:  Your pain gets worse.  Your bloating becomes very bad.  You have a fever or chills, and your symptoms suddenly get worse.  You vomit.  You have bowel movements that are bloody or black.  You have bleeding from your rectum. Summary  Diverticulosis is a condition that develops when small pouches (diverticula) form in the wall of the large intestine (colon).  You may have a few pouches or many of them.  This condition is most often diagnosed during an exam for other colon problems.  If you have had an infection related to diverticulosis, treatment may include increasing the fiber in your diet, taking supplements, or taking medicines. This information is not intended to replace advice given to you by your health care provider. Make sure you discuss any questions you have with your health care provider. Document Released: 09/26/2003 Document Revised: 12/11/2016 Document Reviewed: 11/18/2015 Elsevier Patient Education  [employer].     High-Fiber Diet Fiber, also called dietary fiber, is a type of carbohydrate that is found in fruits, vegetables, whole grains, and beans. A high-fiber diet can have many health benefits. Your health care provider may recommend a high-fiber diet to help:  Prevent constipation. Fiber can make your bowel movements more regular.  Lower your cholesterol.  Relieve the following conditions: ? Swelling of veins in the anus (hemorrhoids). ? Swelling and irritation (inflammation) of specific areas of the digestive tract (uncomplicated diverticulosis). ? A problem of the large intestine (colon) that sometimes causes pain and diarrhea (irritable bowel syndrome, IBS).  Prevent overeating as part of a weight-loss plan.  Prevent heart disease, type 2 diabetes, and certain cancers. What is my plan? The recommended daily  fiber intake in grams (g) includes:  38 g for men age 30 or younger.  30 g for men over age 39.  60 g for women age 15 or younger.  21 g for women over age 32. You can get the recommended daily intake of dietary fiber by:  Eating a variety of fruits, vegetables, grains, and beans.  Taking a fiber supplement, if it is not possible to get enough fiber through your diet. What do I need to know about a high-fiber diet?  It is better to get fiber through food sources rather than from fiber supplements. There is not a lot of research about how effective supplements are.  Always check the fiber content on the  nutrition facts label of any prepackaged food. Look for foods that contain 5 g of fiber or more per serving.  Talk with a diet and nutrition specialist (dietitian) if you have questions about specific foods that are recommended or not recommended for your medical condition, especially if those foods are not listed below.  Gradually increase how much fiber you consume. If you increase your intake of dietary fiber too quickly, you may have bloating, cramping, or gas.  Drink plenty of water. Water helps you to digest fiber. What are tips for following this plan?  Eat a wide variety of high-fiber foods.  Make sure that half of the grains that you eat each day are whole grains.  Eat breads and cereals that are made with whole-grain flour instead of refined flour or white flour.  Eat brown rice, bulgur wheat, or millet instead of white rice.  Start the day with a breakfast that is high in fiber, such as a cereal that contains 5 g of fiber or more per serving.  Use beans in place of meat in soups, salads, and pasta dishes.  Eat high-fiber snacks, such as berries, raw vegetables, nuts, and popcorn.  Choose whole fruits and vegetables instead of processed forms like juice or sauce. What foods can I eat?  Fruits Berries. Pears. Apples. Oranges. Avocado. Prunes and raisins. Dried  figs. Vegetables Sweet potatoes. Spinach. Kale. Artichokes. Cabbage. Broccoli. Cauliflower. Green peas. Carrots. Squash. Grains Whole-grain breads. Multigrain cereal. Oats and oatmeal. Brown rice. Barley. Bulgur wheat. [place]. Quinoa. Bran muffins. Popcorn. Rye wafer crackers. Meats and other proteins Navy, kidney, and pinto beans. Soybeans. Split peas. Lentils. Nuts and seeds. Dairy Fiber-fortified yogurt. Beverages Fiber-fortified soy milk. Fiber-fortified orange juice. Other foods Fiber bars. The items listed above may not be a complete list of recommended foods and beverages. Contact a dietitian for more options. What foods are not recommended? Fruits Fruit juice. Cooked, strained fruit. Vegetables Fried potatoes. Canned vegetables. Well-cooked vegetables. Grains White bread. Pasta made with refined flour. White rice. Meats and other proteins Fatty cuts of meat. Fried chicken or fried fish. Dairy Milk. Yogurt. Cream cheese. Sour cream. Fats and oils Butters. Beverages Soft drinks. Other foods Cakes and pastries. The items listed above may not be a complete list of foods and beverages to avoid. Contact a dietitian for more information. Summary  Fiber is a type of carbohydrate. It is found in fruits, vegetables, whole grains, and beans.  There are many health benefits of eating a high-fiber diet, such as preventing constipation, lowering blood cholesterol, helping with weight loss, and reducing your risk of heart disease, diabetes, and certain cancers.  Gradually increase your intake of fiber. Increasing too fast can result in cramping, bloating, and gas. Drink plenty of water while you increase your fiber.  The best sources of fiber include whole fruits and vegetables, whole grains, nuts, seeds, and beans. This information is not intended to replace advice given to you by your health care provider. Make sure you discuss any questions you have with your health care  provider. Document Released: 12/29/2004 Document Revised: 11/02/2016 Document Reviewed: 11/02/2016 Elsevier Patient Education  2020 [employer].

## 2019-01-04 NOTE — Op Note (Signed)
[facility] Patient Name: Randall Allison Procedure Date: 01/04/2019 1:15 PM MRN: [id number] Date of Birth: 06/30/1975 Attending MD: Hildred Laser , MD CSN: [id number] Age: 43 Admit Type: Outpatient Procedure:                Upper GI endoscopy Indications:              Heartburn, Nausea with vomiting Providers:                Hildred Laser, MD, Otis Peak B. Sharon Seller, RN, Raphael Gibney, Technician Referring MD:             Curlene Labrum, MD Medicines:                Lidocaine spray, Meperidine 50 mg IV, Midazolam 7                            mg IV Complications:            No immediate complications. Estimated Blood Loss:     Estimated blood loss: none. Procedure:                Pre-Anesthesia Assessment:                           - Prior to the procedure, a History and Physical                            was performed, and patient medications and                            allergies were reviewed. The patient's tolerance of                            previous anesthesia was also reviewed. The risks                            and benefits of the procedure and the sedation                            options and risks were discussed with the patient.                            All questions were answered, and informed consent                            was obtained. Prior Anticoagulants: The patient has                            taken no previous anticoagulant or antiplatelet                            agents except for NSAID medication. ASA Grade  Assessment: I - A normal, healthy patient. After                            reviewing the risks and benefits, the patient was                            deemed in satisfactory condition to undergo the                            procedure.                           After obtaining informed consent, the endoscope was                            passed under direct vision. Throughout the                             procedure, the patient's blood pressure, pulse, and                            oxygen saturations were monitored continuously. The                            [id number]) was introduced through the                            mouth, and advanced to the second part of duodenum.                            The upper GI endoscopy was accomplished without                            difficulty. The patient tolerated the procedure                            well. Scope In: 1:34:42 PM Scope Out: 1:42:56 PM Total Procedure Duration: 0 hours 8 minutes 14 seconds  Findings:      The proximal esophagus and mid esophagus were normal.      LA Grade B (one or more mucosal breaks greater than 5 mm, not extending       between the tops of two mucosal folds) esophagitis was found 37 to 38 cm       from the incisors.      The Z-line was regular and was found 38 cm from the incisors.      A 2 cm hiatal hernia was present.      The entire examined stomach was normal.      The duodenal bulb and second portion of the duodenum were normal. Impression:               - Normal proximal esophagus and mid esophagus.                           - LA Grade B reflux esophagitis.                           -  Z-line regular, 38 cm from the incisors.                           - 2 cm hiatal hernia.                           - Normal stomach.                           - Normal duodenal bulb and second portion of the                            duodenum.                           - No specimens collected. Moderate Sedation:      Moderate (conscious) sedation was administered by the endoscopy nurse       and supervised by the endoscopist. The following parameters were       monitored: oxygen saturation, heart rate, blood pressure, CO2       capnography and response to care. Total physician intraservice time was       13 minutes. Recommendation:           - Patient has a contact number  available for                            emergencies. The signs and symptoms of potential                            delayed complications were discussed with the                            patient. Return to normal activities tomorrow.                            Written discharge instructions were provided to the                            patient.                           - Continue present medications.                           - Return to GI clinic in 3 months.                           - See the other procedure note for documentation of                            additional recommendations. Procedure Code(s):        --- Professional ---                           [id number], Esophagogastroduodenoscopy, flexible,  transoral; diagnostic, including collection of                            specimen(s) by brushing or washing, when performed                            (separate procedure)                           G0500, Moderate sedation services provided by the                            same physician or other qualified health care                            professional performing a gastrointestinal                            endoscopic service that sedation supports,                            requiring the presence of an independent trained                            observer to assist in the monitoring of the                            patient's level of consciousness and physiological                            status; initial 15 minutes of intra-service time;                            patient age 78 years or older (additional time may                            be reported with [phone], as appropriate) Diagnosis Code(s):        --- Professional ---                           K21.00, Gastro-esophageal reflux disease with                            esophagitis, without bleeding                           K44.9, Diaphragmatic hernia without obstruction or                             gangrene                           R12, Heartburn                           R11.2, Nausea with vomiting, unspecified CPT copyright 2019 American  Medical Association. All rights reserved. The codes documented in this report are preliminary and upon coder review may  be revised to meet current compliance requirements. Hildred Laser, MD Hildred Laser, MD 01/04/2019 2:29:35 PM This report has been signed electronically. Number of Addenda: 0

## 2019-01-04 NOTE — H&P (Signed)
Randall Allison is an 43 y.o. male.   Chief Complaint: Patient is here for esophagogastroduodenoscopy and colonoscopy. HPI: Patient is 43 year old Caucasian male who was seen in the office on 11/24/2018 with history of frequent heartburn nausea and vomiting which subsided after he was begun on PPI and sucralfate by Dr. Blake Divine also recommended stopping Goody powder.  Denies cholelithiasis which was felt to be asymptomatic.  He is also been having intermittent rectal bleeding.  His bowels have been irregular.  He states he only had one episode of bleeding few days ago.  His appetite is good and his weight has been stable. Family history is negative for CRC or IBD.  Past Medical History:  Diagnosis Date  . BRBPR (bright red blood per rectum)   . kidney stones        GERD.       Asymptomatic cholelithiasis.  Past Surgical History:  Procedure Laterality Date  . CARDIAC SURGERY     43 years old  . UMBILICAL HERNIA REPAIR N/A 11/11/2018   Procedure: HERNIA REPAIR SUPRAUMBILICAL ADULT;  Surgeon: [name], MD;  Location: [facility];  Service: General;  Laterality: N/A;    Family History  Problem Relation Age of Onset  . Hernia Father   . Prostate cancer Father   . Renal cancer Father   . Hernia Brother    Social History:  reports that he has never smoked. He has never used smokeless tobacco. He reports current alcohol use. He reports that he does not use drugs.  Allergies: No Known Allergies  Medications Prior to Admission  Medication Sig Dispense Refill  . dicyclomine (BENTYL) 10 MG capsule Take 1 capsule (10 mg total) by mouth 3 (three) times daily as needed for spasms. 90 capsule 2  . docusate sodium (COLACE) 100 MG capsule Take 1 capsule (100 mg total) by mouth 2 (two) times daily. (Patient taking differently: Take 100 mg by mouth daily as needed for mild constipation. ) 60 capsule 2  . ibuprofen (ADVIL) 200 MG tablet Take 400 mg by mouth every 8 (eight) hours as needed  (pain).    Marland Kitchen omeprazole (PRILOSEC) 40 MG capsule Take 1 capsule (40 mg total) by mouth daily. 30 capsule 1  . oxyCODONE (ROXICODONE) 5 MG immediate release tablet Take 1 tablet (5 mg total) by mouth every 4 (four) hours as needed for severe pain or breakthrough pain. (Patient not taking: Reported on 12/26/2018) 30 tablet 0  . sucralfate (CARAFATE) 1 g tablet Take 1 tablet (1 g total) by mouth 4 (four) times daily. (Patient taking differently: Take 1 g by mouth 4 (four) times daily as needed (stomach). ) 120 tablet 1    No results found for this or any previous visit (from the past 48 hour(s)). No results found.  Review of Systems  Blood pressure 118/87, pulse 76, temperature 98.9 F (37.2 C), temperature source Oral, resp. rate 13, height 5\' 8"  (1.727 m), weight 73.5 kg, SpO2 100 %. Physical Exam  Constitutional: He appears well-developed and well-nourished.  HENT:  Mouth/Throat: Oropharynx is clear and moist.  Eyes: Conjunctivae are normal. No scleral icterus.  Neck: No thyromegaly present.  Cardiovascular: Normal rate, regular rhythm and normal heart sounds.  No murmur heard. Respiratory: Effort normal and breath sounds normal.  Midsternal scar.  GI:  Abdomen is symmetrical.  He has small vertical scar in epigastric region.  Sternal scar extends to just below CP sternal.  Abdomen is soft.  He has mild tenderness at  LLQ.  No organomegaly or masses  Musculoskeletal:        General: No edema.  Lymphadenopathy:    He has no cervical adenopathy.  Neurological: He is alert.  Skin: Skin is warm and dry.     Assessment/Plan History of nausea vomiting and GERD. Patient has gallstones felt to be asymptomatic. Rectal bleeding. Diagnostic esophagogastroduodenoscopy and colonoscopy.  Hildred Laser, MD 01/04/2019, 1:22 PM

## 2019-01-04 NOTE — Op Note (Signed)
[facility] Patient Name: Randall Allison Procedure Date: 01/04/2019 1:46 PM MRN: [id number] Date of Birth: 1975-12-31 Attending MD: Hildred Laser , MD CSN: [id number] Age: 43 Admit Type: Outpatient Procedure:                Colonoscopy Indications:              Rectal bleeding Providers:                Hildred Laser, MD, Jeanann Lewandowsky. Sharon Seller, RN, Raphael Gibney, Technician Referring MD:              Medicines:                Meperidine 25 mg IV, Midazolam 1 mg IV Complications:            No immediate complications. Estimated Blood Loss:     Estimated blood loss was minimal. Procedure:                Pre-Anesthesia Assessment:                           - Prior to the procedure, a History and Physical                            was performed, and patient medications and                            allergies were reviewed. The patient's tolerance of                            previous anesthesia was also reviewed. The risks                            and benefits of the procedure and the sedation                            options and risks were discussed with the patient.                            All questions were answered, and informed consent                            was obtained. Prior Anticoagulants: The patient has                            taken no previous anticoagulant or antiplatelet                            agents except for NSAID medication. ASA Grade                            Assessment: I - A normal, healthy patient. After  reviewing the risks and benefits, the patient was                            deemed in satisfactory condition to undergo the                            procedure.                           After obtaining informed consent, the colonoscope                            was passed under direct vision. Throughout the                            procedure, the patient's blood pressure, pulse, and                             oxygen saturations were monitored continuously. The                            PCF-H190DL [id number] scope was introduced through                            the anus and advanced to the the cecum, identified                            by appendiceal orifice and ileocecal valve. The                            colonoscopy was performed without difficulty. The                            patient tolerated the procedure well. The quality                            of the bowel preparation was fair. The ileocecal                            valve, appendiceal orifice, and rectum were                            photographed. Scope In: 1:48:19 PM Scope Out: 2:18:13 PM Scope Withdrawal Time: 0 hours 14 minutes 45 seconds  Total Procedure Duration: 0 hours 29 minutes 54 seconds  Findings:      The perianal and digital rectal examinations were normal.      A single diverticulum was found in the cecum.      Scattered diverticula were found in the sigmoid colon.      A 4 mm polyp was found in the distal sigmoid colon. The polyp was       removed with a cold snare. Resection and retrieval were complete.      Internal hemorrhoids were found during retroflexion. The hemorrhoids       were small. Impression:               -  Preparation of the colon was fair.                           - Diverticulosis in the cecum.                           - Diverticulosis in the sigmoid colon.                           - One 4 mm polyp in the distal sigmoid colon,                            removed with a cold snare. Resected and retrieved.                           - Internal hemorrhoids. Moderate Sedation:      Moderate (conscious) sedation was administered by the endoscopy nurse       and supervised by the endoscopist. The following parameters were       monitored: oxygen saturation, heart rate, blood pressure, CO2       capnography and response to care. Total physician intraservice time  was       36 minutes. Recommendation:           - Patient has a contact number available for                            emergencies. The signs and symptoms of potential                            delayed complications were discussed with the                            patient. Return to normal activities tomorrow.                            Written discharge instructions were provided to the                            patient.                           - High fiber diet today.                           - Continue present medications.                           - No aspirin, ibuprofen, naproxen, or other                            non-steroidal anti-inflammatory drugs for 1 day.                           - Await pathology results.                           -  Repeat colonoscopy is recommended. The                            colonoscopy date will be determined after pathology                            results from today's exam become available for                            review. Procedure Code(s):        --- Professional ---                           [phone], Colonoscopy, flexible; with removal of                            tumor(s), polyp(s), or other lesion(s) by snare                            technique                           99153, Moderate sedation; each additional 15                            minutes intraservice time                           G0500, Moderate sedation services provided by the                            same physician or other qualified health care                            professional performing a gastrointestinal                            endoscopic service that sedation supports,                            requiring the presence of an independent trained                            observer to assist in the monitoring of the                            patient's level of consciousness and physiological                            status; initial 15 minutes of  intra-service time;                            patient age 28 years or older (additional time may                            be  reported with [phone], as appropriate) Diagnosis Code(s):        --- Professional ---                           K64.8, Other hemorrhoids                           K63.5, Polyp of colon                           K62.5, Hemorrhage of anus and rectum                           K57.30, Diverticulosis of large intestine without                            perforation or abscess without bleeding CPT copyright 2019 [employer]. All rights reserved. The codes documented in this report are preliminary and upon coder review may  be revised to meet current compliance requirements. Hildred Laser, MD Hildred Laser, MD 01/04/2019 2:34:50 PM This report has been signed electronically. Number of Addenda: 0

## 2019-01-09 LAB — SURGICAL PATHOLOGY

## 2019-04-11 ENCOUNTER — Ambulatory Visit (INDEPENDENT_AMBULATORY_CARE_PROVIDER_SITE_OTHER): Payer: Self-pay | Admitting: Internal Medicine

## 2019-10-24 ENCOUNTER — Other Ambulatory Visit: Payer: Self-pay

## 2019-10-24 ENCOUNTER — Other Ambulatory Visit: Payer: Self-pay | Admitting: *Deleted

## 2019-10-24 DIAGNOSIS — Z20822 Contact with and (suspected) exposure to covid-19: Secondary | ICD-10-CM

## 2019-10-25 LAB — SARS-COV-2, NAA 2 DAY TAT

## 2019-10-25 LAB — NOVEL CORONAVIRUS, NAA: SARS-CoV-2, NAA: NOT DETECTED

## 2019-10-25 LAB — SPECIMEN STATUS REPORT

## 2021-10-13 IMAGING — CT CT ABD-PELV W/ CM
2 of 5 series · 15 of 46 positions shown, 17 images · IV contrast (omnipaque)
Comparison: None.

CLINICAL DATA: Abdominal distension, mid abdominal pain, nausea,
vomiting, episode of rectal bleeding

EXAM:
CT ABDOMEN AND PELVIS WITH CONTRAST
TECHNIQUE: Multidetector CT imaging of the abdomen and pelvis was performed
using the standard protocol following bolus administration of
intravenous contrast.
CONTRAST:  100mL OMNIPAQUE IOHEXOL 300 MG/ML  SOLN

[Series 2: axial st · axial · 0.68mm/px · z∈[+744,+1209]mm · 12 of 109 slices shown, 14 images]
[im 8/109  soft-tissue]
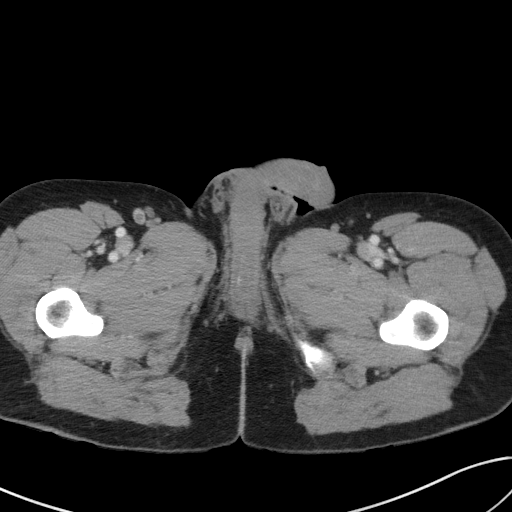
[im 8/109  bone]
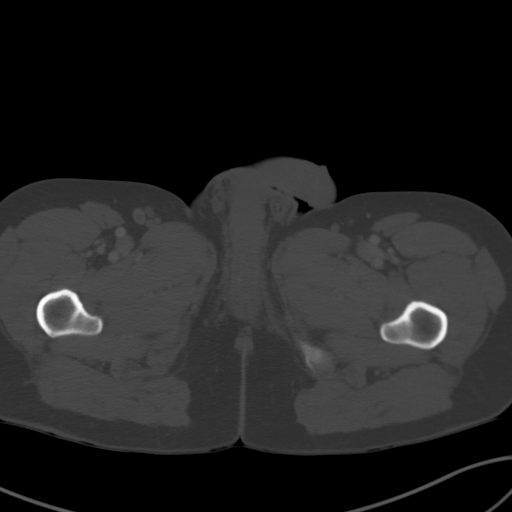
[im 16/109  soft-tissue]
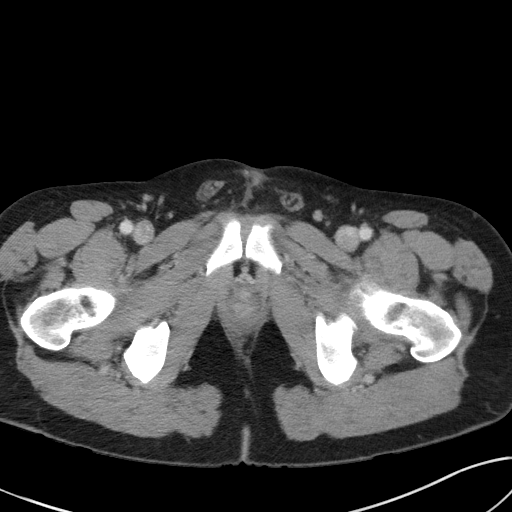
[im 24/109  soft-tissue]
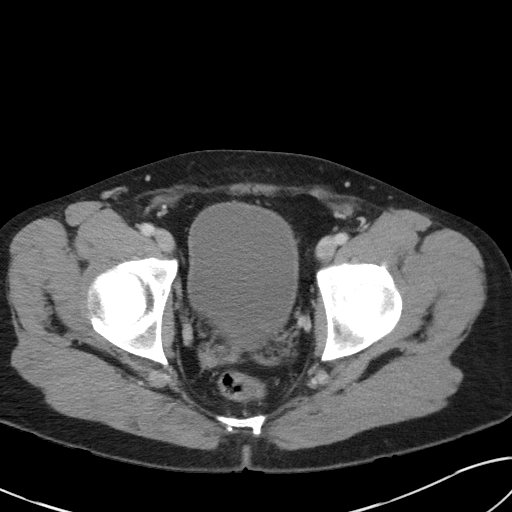
[im 31/109  soft-tissue]
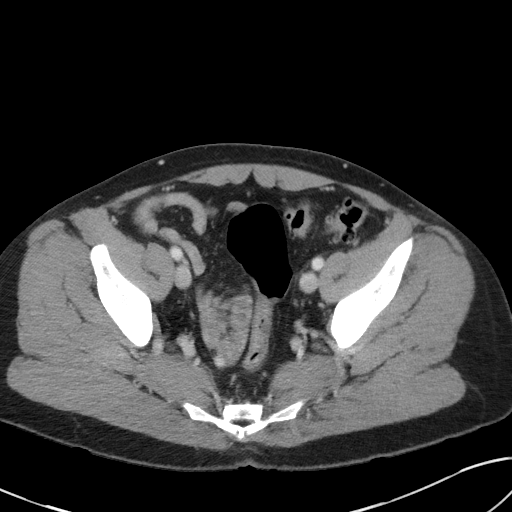
[im 39/109  soft-tissue]
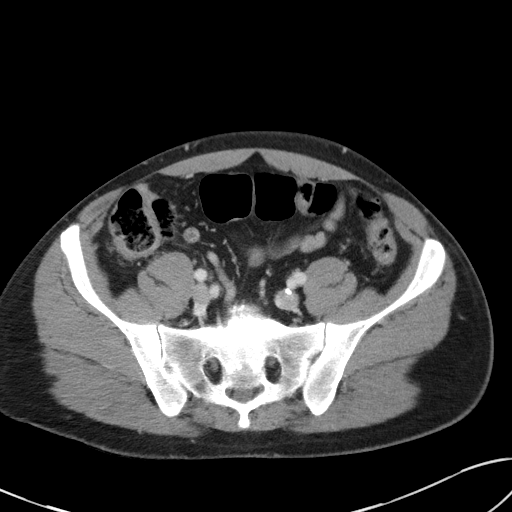
[im 47/109  soft-tissue]
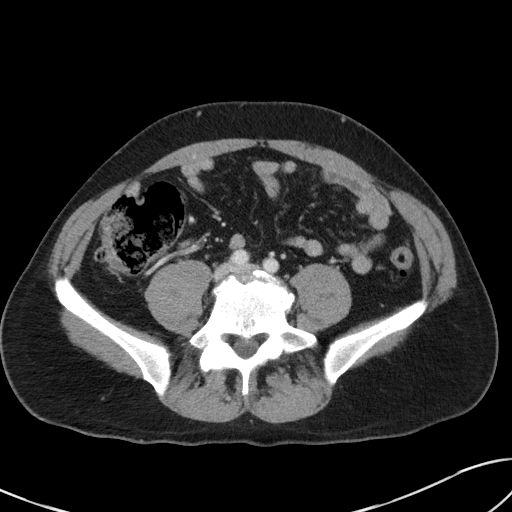
[im 62/109  soft-tissue]
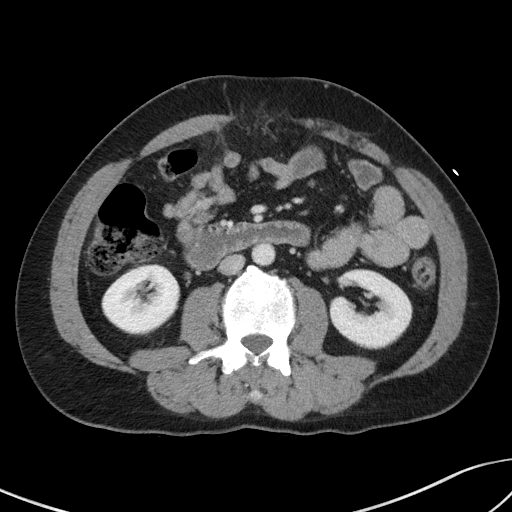
[im 70/109  soft-tissue]
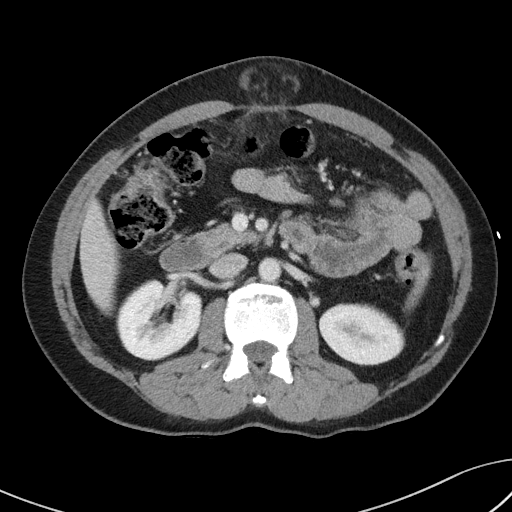
[im 78/109  soft-tissue]
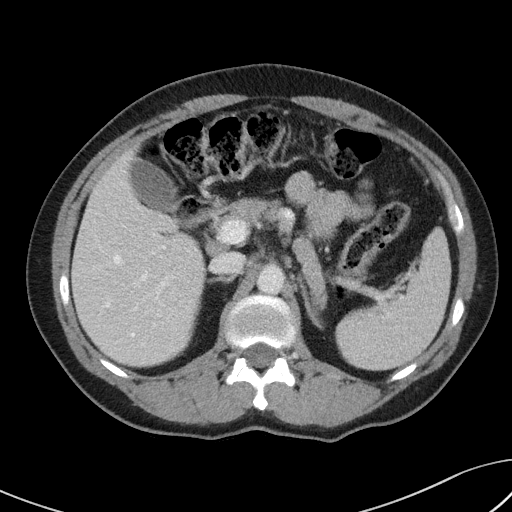
[im 78/109  bone]
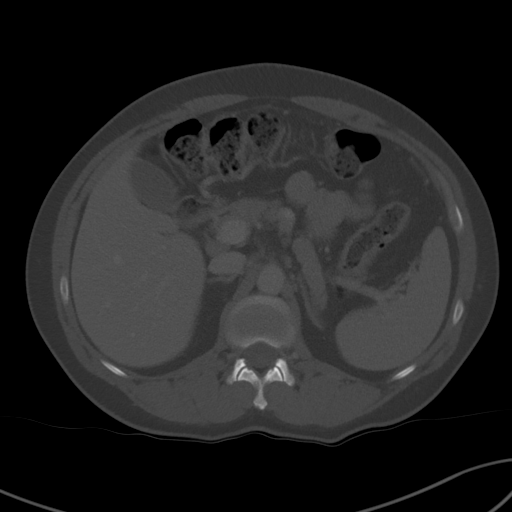
[im 85/109  soft-tissue]
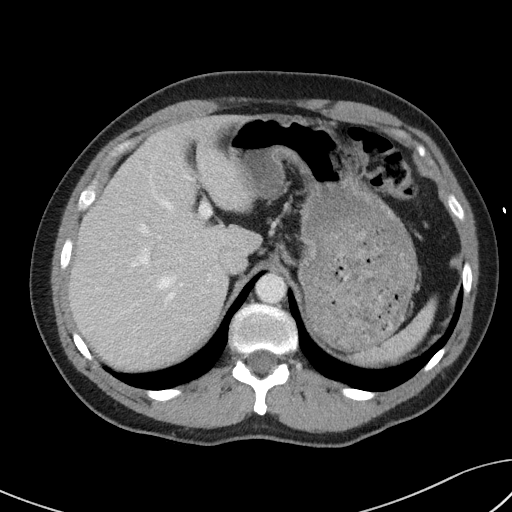
[im 93/109  soft-tissue]
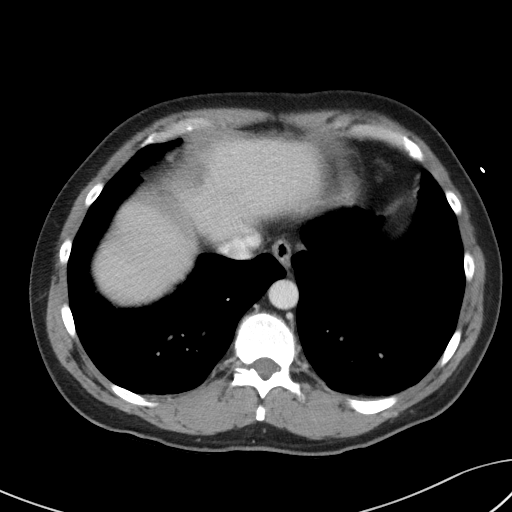
[im 101/109  soft-tissue]
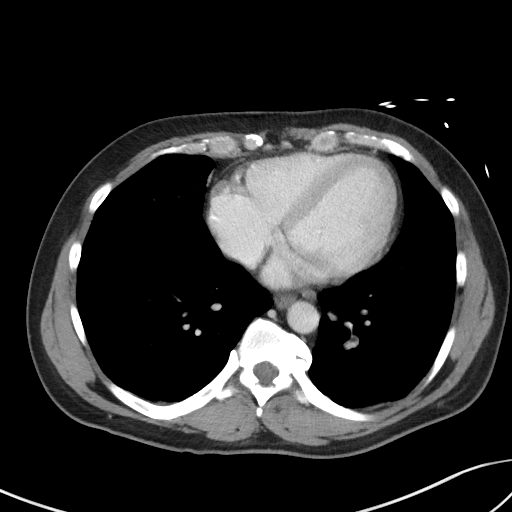

[Series 6: coronal st · coronal · 0.70mm/px · 3 of 110 slices shown]
[im 37/110  soft-tissue]
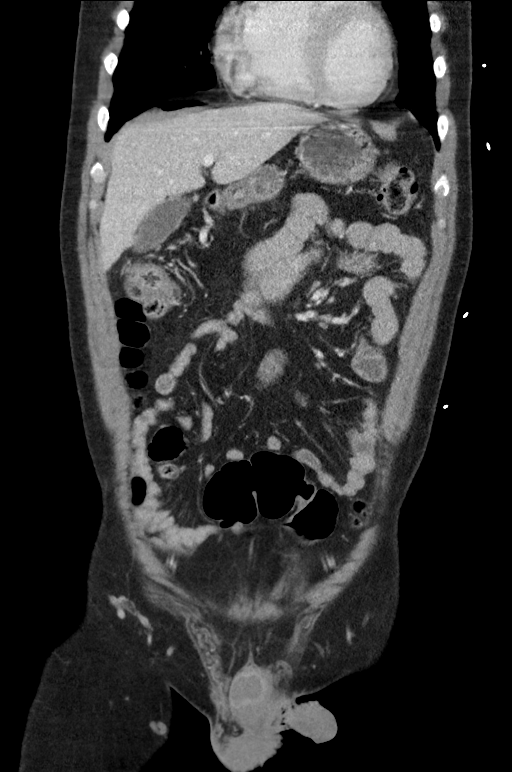
[im 49/110  soft-tissue]
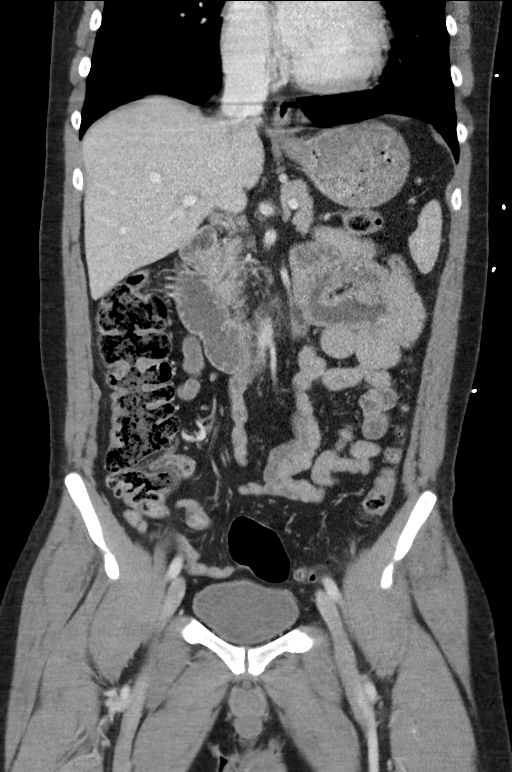
[im 61/110  soft-tissue]
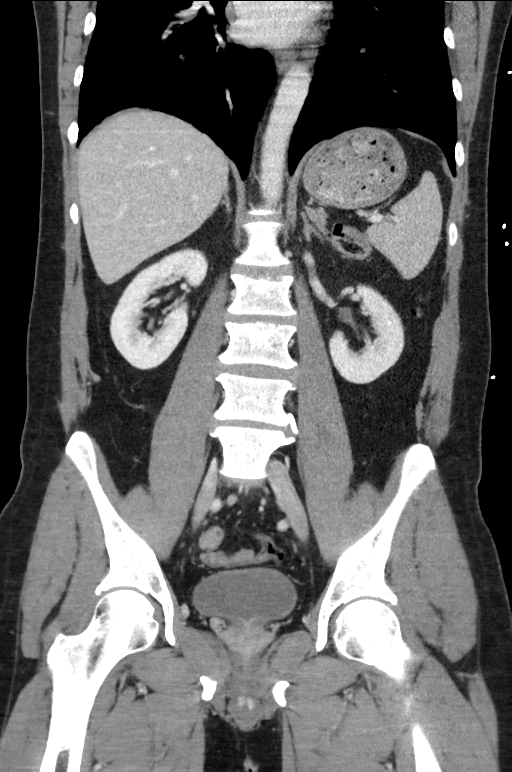

[15 of 46 positions shown; findings below may reference images not displayed]

FINDINGS: Lower chest: No acute abnormality.

Hepatobiliary: No solid liver abnormality is seen. Calcified
gallstone in the gallbladder. No gallbladder wall thickening, or
biliary dilatation.

Pancreas: Unremarkable. No pancreatic ductal dilatation or
surrounding inflammatory changes.

Spleen: Normal in size without significant abnormality.

Adrenals/Urinary Tract: Adrenal glands are unremarkable. Kidneys are
normal, without renal calculi, solid lesion, or hydronephrosis.
Bladder is unremarkable.

Stomach/Bowel: Stomach is within normal limits. Appendix appears
normal. No evidence of bowel wall thickening, distention, or
inflammatory changes. Sigmoid diverticulosis. Large burden of stool
in the proximal colon.

Vascular/Lymphatic: Scattered IVC and iliac vein calcifications. No
enlarged abdominal or pelvic lymph nodes.

Reproductive: No mass or other significant abnormality.

Other: There is a fat containing midline ventral epigastric hernia,
measuring approximately 4.0 x 3.2 cm, hernia neck measuring 2.3 cm
(series 2, image 39). No abdominopelvic ascites.

Musculoskeletal: No acute or significant osseous findings.
IMPRESSION: 1. There is a fat containing midline ventral epigastric hernia,
measuring approximately 4.0 x 3.2 cm, hernia neck measuring 2.3 cm
(series 2, image 39).

2.  Cholelithiasis.

3.  Sigmoid diverticulosis.

4. Scattered IVC and iliac vein calcifications, which may be related
to prior thrombosis.

## 2021-11-22 ENCOUNTER — Encounter (INDEPENDENT_AMBULATORY_CARE_PROVIDER_SITE_OTHER): Payer: Self-pay | Admitting: Gastroenterology

## 2021-12-26 ENCOUNTER — Other Ambulatory Visit: Payer: Self-pay | Admitting: Internal Medicine

## 2021-12-26 DIAGNOSIS — R634 Abnormal weight loss: Secondary | ICD-10-CM

## 2021-12-31 ENCOUNTER — Encounter (INDEPENDENT_AMBULATORY_CARE_PROVIDER_SITE_OTHER): Payer: Self-pay | Admitting: *Deleted

## 2022-01-16 ENCOUNTER — Other Ambulatory Visit: Payer: Self-pay | Admitting: *Deleted

## 2022-01-16 DIAGNOSIS — K429 Umbilical hernia without obstruction or gangrene: Secondary | ICD-10-CM

## 2022-01-20 ENCOUNTER — Encounter: Payer: Self-pay | Admitting: General Surgery

## 2022-01-20 ENCOUNTER — Ambulatory Visit (INDEPENDENT_AMBULATORY_CARE_PROVIDER_SITE_OTHER): Payer: BC Managed Care – PPO | Admitting: General Surgery

## 2022-01-20 VITALS — BP 109/78 | HR 85 | Temp 97.7°F | Resp 14 | Ht 68.0 in | Wt 151.0 lb

## 2022-01-20 DIAGNOSIS — K429 Umbilical hernia without obstruction or gangrene: Secondary | ICD-10-CM | POA: Diagnosis not present

## 2022-01-20 NOTE — Patient Instructions (Signed)
Monitor for your hernia getting worse.  Call when you decide you want to get the hernia fixed. If you see your Cardiologist between now and getting the hernia fixed, ask them to "risk stratify you for surgery."   Ventral Hernia  A ventral hernia is a bulge of tissue from inside the abdomen that pushes through a weak area of the muscles that form the front wall of the abdomen. The tissues inside the abdomen are inside a sac (peritoneum). These tissues include the small intestine, large intestine, and the fatty tissue that covers the intestines (omentum). Sometimes, the bulge that forms a hernia contains intestines. Other hernias contain only fat. Ventral hernias do not go away without surgical treatment. There are several types of ventral hernias. You may have: A hernia at an incision site from previous abdominal surgery (incisional hernia). A hernia just above the belly button (epigastric hernia), or at the belly button (umbilical hernia). These types of hernias can develop from heavy lifting or straining. A hernia that comes and goes (reducible hernia). It may be visible only when you lift or strain. This type of hernia can be pushed back into the abdomen (reduced). A hernia that traps abdominal tissue inside the hernia (incarcerated hernia). This type of hernia does not reduce. A hernia that cuts off blood flow to the tissues inside the hernia (strangulated hernia). The tissues can start to die if this happens. This is a very painful bulge that cannot be reduced. A strangulated hernia is a medical emergency. What are the causes? This condition is caused by abdominal tissue putting pressure on an area of weakness in the abdominal muscles. What increases the risk? The following factors may make you more likely to develop this condition: Being age 29 or older. Being overweight or obese. Having had previous abdominal surgery, especially if there was an infection after surgery. Having had an injury  to the abdominal wall. Frequently lifting or pushing heavy objects. Having had several pregnancies. Having a buildup of fluid inside the abdomen (ascites). Straining to have a bowel movement or to urinate. Having frequent coughing episodes. What are the signs or symptoms? The only symptom of a ventral hernia may be a painless bulge in the abdomen. A reducible hernia may be visible only when you strain, cough, or lift. Other symptoms may include: Dull pain. A feeling of pressure. Signs and symptoms of a strangulated hernia may include: Increasing pain. Nausea and vomiting. Pain when pressing on the hernia. The skin over the hernia turning red or purple. Constipation. Blood in the stool (feces). How is this diagnosed? This condition may be diagnosed based on: Your symptoms. Your medical history. A physical exam. You may be asked to cough or strain while standing. These actions increase the pressure inside your abdomen and force the hernia through the opening in your muscles. Your health care provider may try to reduce the hernia by gently pushing the hernia back in. Imaging studies, such as an ultrasound or CT scan. How is this treated? This condition is treated with surgery. If you have a strangulated hernia, surgery is done as soon as possible. If your hernia is small and not incarcerated, you may be asked to lose some weight before surgery. Follow these instructions at home: Follow instructions from your health care provider about eating or drinking restrictions. If you are overweight, your health care provider may recommend that you increase your activity level and eat a healthier diet. Do not lift anything that is heavier than  10 lb (4.5 kg), or the limit that you are told, until your health care provider says that it is safe. Return to your normal activities as told by your health care provider. Ask your health care provider what activities are safe for you. You may need to avoid  activities that increase pressure on your hernia. Take over-the-counter and prescription medicines only as told by your health care provider. Keep all follow-up visits. This is important. Contact a health care provider if: Your hernia gets larger. Your hernia becomes painful. Get help right away if: Your hernia becomes increasingly painful. You have pain along with any of the following: Changes in skin color in the area of the hernia. Nausea. Vomiting. Fever. These symptoms may represent a serious problem that is an emergency. Do not wait to see if the symptoms will go away. Get medical help right away. Call your local emergency services (911 in the [place] Do not drive yourself to the hospital. Summary A ventral hernia is a bulge of tissue from inside the abdomen that pushes through a weak area of the muscles that form the front wall of the abdomen. This condition is treated with surgery, which may be urgent depending on your hernia. Do not lift anything that is heavier than 10 lb (4.5 kg), and follow activity instructions from your health care provider. This information is not intended to replace advice given to you by your health care provider. Make sure you discuss any questions you have with your health care provider. Document Revised: 08/18/2019 Document Reviewed: 08/18/2019 Elsevier Patient Education  [place].

## 2022-01-20 NOTE — Progress Notes (Signed)
[facility] Note   HPI:  47 y.o. Male presents to clinic for evaluation as he has noticed he has a new bulge at his umbilicus. He had a supraumbilical hernia repair with mesh with me in 2020 and has done well. 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 He denies any incarceration symptoms, denies any obstructive symptoms. He can just feel the area.   He had a large workup for this syncopal event and reports that they did not find anything. He is going to start back work soon. He wants to avoid surgery on this hernia for now.   Review of Systems:  No obstructive symptoms Pain at the umbilicus  All other review of systems: otherwise negative   Vital Signs:  BP 109/78   Pulse 85   Temp 97.7 F (36.5 C) (Oral)   Resp 14   Ht '5\' 8"'$  (1.727 m)   Wt 151 lb (68.5 kg)   SpO2 99%   BMI 22.96 kg/m    Physical Exam:  Physical Exam Vitals reviewed.  HENT:     Head: Normocephalic.  Cardiovascular:     Rate and Rhythm: Normal rate.  Pulmonary:     Effort: Pulmonary effort is normal.  Abdominal:     General: There is no distension.     Palpations: Abdomen is soft.     Tenderness: There is abdominal tenderness.     Hernia: A hernia is present.     Comments: Umbilical defect about 6-7TI, supraumbilical hernia repair feels intact   Skin:    General: Skin is warm.  Neurological:     General: No focal deficit present.      Assessment:  47 y.o. yo Male with what feels like a umbilical defect below the prior supraumbilical hernia repair. He wants to get it repaired but wants to wait until he is back at work for a while. Discussed reasons for Ed visit like incarceration, strangulation.  Plan:  Monitor for your hernia getting worse.  Call when you decide you want to get the hernia fixed. If you see your Cardiologist between now and getting the hernia fixed, ask them to "risk stratify you for surgery."    PRN follow up Likely he will want to do something in the spring/ summer. Plan for laparoscopic robotic assisted ventral hernia repair with mesh  Curlene Labrum, MD [facility] [address] [place], [place] [phone] (office)

## 2022-02-05 ENCOUNTER — Encounter: Payer: Self-pay | Admitting: Internal Medicine

## 2022-02-06 ENCOUNTER — Inpatient Hospital Stay: Admission: RE | Admit: 2022-02-06 | Payer: Self-pay | Source: Ambulatory Visit

## 2023-08-09 ENCOUNTER — Emergency Department (HOSPITAL_COMMUNITY)

## 2023-08-09 ENCOUNTER — Encounter (HOSPITAL_COMMUNITY): Payer: Self-pay

## 2023-08-09 ENCOUNTER — Emergency Department (HOSPITAL_COMMUNITY)
Admission: EM | Admit: 2023-08-09 | Discharge: 2023-08-09 | Disposition: A | Discharge status: Home / Self Care | Attending: Emergency Medicine | Admitting: Emergency Medicine

## 2023-08-09 ENCOUNTER — Other Ambulatory Visit: Payer: Self-pay

## 2023-08-09 DIAGNOSIS — K30 Functional dyspepsia: Secondary | ICD-10-CM | POA: Diagnosis not present

## 2023-08-09 DIAGNOSIS — F17210 Nicotine dependence, cigarettes, uncomplicated: Secondary | ICD-10-CM | POA: Insufficient documentation

## 2023-08-09 DIAGNOSIS — R5383 Other fatigue: Secondary | ICD-10-CM | POA: Diagnosis not present

## 2023-08-09 DIAGNOSIS — R079 Chest pain, unspecified: Secondary | ICD-10-CM | POA: Diagnosis present

## 2023-08-09 DIAGNOSIS — Z87442 Personal history of urinary calculi: Secondary | ICD-10-CM | POA: Insufficient documentation

## 2023-08-09 DIAGNOSIS — R072 Precordial pain: Secondary | ICD-10-CM | POA: Diagnosis not present

## 2023-08-09 DIAGNOSIS — R0789 Other chest pain: Secondary | ICD-10-CM

## 2023-08-09 LAB — CBC
HCT: 44.7 % (ref 39.0–52.0)
Hemoglobin: 15.2 g/dL (ref 13.0–17.0)
MCH: 32.5 pg (ref 26.0–34.0)
MCHC: 34 g/dL (ref 30.0–36.0)
MCV: 95.7 fL (ref 80.0–100.0)
Platelets: 226 K/uL (ref 150–400)
RBC: 4.67 MIL/uL (ref 4.22–5.81)
RDW: 12.1 % (ref 11.5–15.5)
WBC: 5.6 K/uL (ref 4.0–10.5)
nRBC: 0 % (ref 0.0–0.2)

## 2023-08-09 LAB — TROPONIN I (HIGH SENSITIVITY)
Troponin I (High Sensitivity): 22 ng/L — ABNORMAL HIGH (ref <18)
Troponin I (High Sensitivity): 17 ng/L (ref <18)

## 2023-08-09 LAB — HEPATIC FUNCTION PANEL
ALT: 17 U/L (ref 0–44)
AST: 21 U/L (ref 15–41)
Albumin: 3.6 g/dL (ref 3.5–5.0)
Alkaline Phosphatase: 89 U/L (ref 38–126)
Bilirubin, Direct: 0.1 mg/dL (ref 0.0–0.2)
Indirect Bilirubin: 0.5 mg/dL (ref 0.3–0.9)
Total Bilirubin: 0.6 mg/dL (ref 0.0–1.2)
Total Protein: 6.8 g/dL (ref 6.5–8.1)

## 2023-08-09 LAB — BASIC METABOLIC PANEL WITH GFR
Anion gap: 8 (ref 5–15)
BUN: 12 mg/dL (ref 6–20)
CO2: 25 mmol/L (ref 22–32)
Calcium: 8.7 mg/dL — ABNORMAL LOW (ref 8.9–10.3)
Chloride: 102 mmol/L (ref 98–111)
Creatinine, Ser: 0.93 mg/dL (ref 0.61–1.24)
GFR, Estimated: >60 mL/min (ref >60)
Glucose, Bld: 137 mg/dL — ABNORMAL HIGH (ref 70–99)
Potassium: 4.2 mmol/L (ref 3.5–5.1)
Sodium: 135 mmol/L (ref 135–145)

## 2023-08-09 LAB — LIPASE, BLOOD: Lipase: 32 U/L (ref 11–51)

## 2023-08-09 MED ORDER — ALUM & MAG HYDROXIDE-SIMETH 200-200-20 MG/5ML PO SUSP
30.0000 mL | Freq: Once | ORAL | Status: AC
  Administered 2023-08-09: 30 mL via ORAL
  Filled 2023-08-09: qty 30

## 2023-08-09 MED ORDER — SODIUM CHLORIDE 0.9 % IV BOLUS
1000.0000 mL | Freq: Once | INTRAVENOUS | Status: AC
  Administered 2023-08-09: 1000 mL via INTRAVENOUS

## 2023-08-09 MED ORDER — IOHEXOL 350 MG/ML SOLN
100.0000 mL | Freq: Once | INTRAVENOUS | Status: AC | PRN
  Administered 2023-08-09: 100 mL via INTRAVENOUS

## 2023-08-09 MED ORDER — SUCRALFATE 1 G PO TABS: 1.0000 g | ORAL_TABLET | Freq: Three times a day (TID) | ORAL | 0 refills | Status: AC

## 2023-08-09 MED ORDER — PANTOPRAZOLE SODIUM 20 MG PO TBEC: 20.0000 mg | DELAYED_RELEASE_TABLET | Freq: Every day | ORAL | 0 refills | Status: AC

## 2023-08-09 NOTE — ED Notes (Signed)
Patient given cup of water and crackers

## 2023-08-09 NOTE — ED Notes (Signed)
 Patient tolerated crackers and water  no complaints. MD notified.

## 2023-08-09 NOTE — ED Provider Notes (Signed)
 [place] EMERGENCY DEPARTMENT AT [facility] Provider Note  CSN: [id number] Arrival date & time: 08/09/23 9097  Chief Complaint(s) Chest Pain  HPI Randall Allison is a 48 y.o. male with past medical history as below, significant for umbilical hernia, cholelithiasis, GERD, IBS childhood cardiac surgery, daily tobacco use, report of aortic aneurysm who presents to the ED with complaint of chest pain, epigastric pain, fatigue  Patient reports he is not feeling well over the past 2 days, burning sensation in his chest and upper abdomen.  Nausea without vomiting.  Symptoms have been intermittent but seem to be worsening.  No frequent NSAID or alcohol use, no BRBPR or melena.  Symptoms provoked by eating fatty or greasy foods.  He is feeling increased fatigue, generalized weakness.  Patient reports that he was told he had an aneurysm in his aorta and he is worried it might be worsening.   Past Medical History Past Medical History:  Diagnosis Date   BRBPR (bright red blood per rectum)    kidney stones    Patient Active Problem List   Diagnosis Date Noted   Asymptomatic cholelithiasis 11/24/2018   GERD (gastroesophageal reflux disease) 11/24/2018   IBS (irritable colon syndrome) 11/24/2018   Non-intractable vomiting 11/10/2018   BRBPR (bright red blood per rectum) 11/10/2018   Supraumbilical hernia without gangrene and without obstruction 11/08/2018   Home Medication(s) Prior to Admission medications   Medication Sig Start Date End Date Taking? Authorizing Provider  metoprolol succinate (TOPROL-XL) 25 MG 24 hr tablet Take 25 mg by mouth daily. 01/01/22   [provider]  sacubitril-valsartan (ENTRESTO) 24-26 MG Take 1 tablet by mouth 2 (two) times daily.    [provider]                                                                                                                                    Past Surgical History Past Surgical History:  Procedure  Laterality Date   CARDIAC SURGERY     48 years old   COLONOSCOPY N/A 01/04/2019   Procedure: COLONOSCOPY;  Surgeon: Golda Claudis PENNER, MD;  [facility];  Service: Endoscopy;  Laterality: N/A;  125   ESOPHAGOGASTRODUODENOSCOPY N/A 01/04/2019   Procedure: ESOPHAGOGASTRODUODENOSCOPY (EGD);  Surgeon: Golda Claudis PENNER, MD;  Location: [facility];  Service: Endoscopy;  Laterality: N/A;   POLYPECTOMY  01/04/2019   Procedure: POLYPECTOMY;  Surgeon: Golda Claudis PENNER, MD;  Location: [facility];  Service: Endoscopy;;   UMBILICAL HERNIA REPAIR N/A 11/11/2018   Procedure: HERNIA REPAIR SUPRAUMBILICAL ADULT;  Surgeon: Kallie Manuelita BROCKS, MD;  Location: [facility] OR[facility];  Service: General;  Laterality: N/A;   Family History Family History  Problem Relation Age of Onset   Hernia Father    Prostate cancer Father    Renal cancer Father    Hernia Brother     Social History Social History   Tobacco Use  Smoking status: Every Day    Current packs/day: 0.50    Average packs/day: 0.5 packs/day for 15.0 years (7.5 ttl pk-yrs)    Types: Cigarettes   Smokeless tobacco: Never  Vaping Use   Vaping status: Never Used  Substance Use Topics   Alcohol use: Yes    Comment: 1 or 2 beers/day   Drug use: No   Allergies Patient has no known allergies.  Review of Systems A thorough review of systems was obtained and all systems are negative except as noted in the HPI and PMH.   Physical Exam Vital Signs  I have reviewed the triage vital signs BP (!) 137/96 (BP Location: Left Arm)   Pulse 80   Temp 97.6 F (36.4 C) (Oral)   Resp (!) 22   Ht 5' 8 (1.727 m)   Wt 71.2 kg   SpO2 96%   BMI 23.87 kg/m  Physical Exam Vitals and nursing note reviewed.  Constitutional:      General: He is not in acute distress.    Appearance: He is well-developed.  HENT:     Head: Normocephalic and atraumatic.     Right Ear: External ear normal.     Left Ear: External ear normal.     Mouth/Throat:     Mouth:  Mucous membranes are moist.  Eyes:     General: No scleral icterus. Cardiovascular:     Rate and Rhythm: Normal rate and regular rhythm.     Pulses: Normal pulses.     Heart sounds: Normal heart sounds.  Pulmonary:     Effort: Pulmonary effort is normal. No respiratory distress.     Breath sounds: Normal breath sounds.  Abdominal:     General: Abdomen is flat.     Palpations: Abdomen is soft.     Tenderness: There is no abdominal tenderness.  Musculoskeletal:     Cervical back: No rigidity.     Right lower leg: No edema.     Left lower leg: No edema.  Skin:    General: Skin is warm and dry.     Capillary Refill: Capillary refill takes less than 2 seconds.  Neurological:     Mental Status: He is alert.  Psychiatric:        Mood and Affect: Mood normal.        Behavior: Behavior normal.     ED Results and Treatments Labs (all labs ordered are listed, but only abnormal results are displayed) Labs Reviewed  CBC  BASIC METABOLIC PANEL WITH GFR  HEPATIC FUNCTION PANEL  LIPASE, BLOOD  TROPONIN I (HIGH SENSITIVITY)                                                                                                                          Radiology No results found.  Pertinent labs & imaging results that were available during my care of the patient were reviewed by me and considered in my medical decision making (see MDM for  details).  Medications Ordered in ED Medications  alum & mag hydroxide-simeth (MAALOX/MYLANTA) 200-200-20 MG/5ML suspension 30 mL (has no administration in time range)  sodium chloride  0.9 % bolus 1,000 mL (has no administration in time range)                                                                                                                                     Procedures Procedures  (including critical care time)  Medical Decision Making / ED Course    Medical Decision Making:    Randall Allison is a 48 y.o. male with past medical  history as below, significant for umbilical hernia, cholelithiasis, GERD, IBS childhood cardiac surgery, daily tobacco use, report of aortic aneurysm who presents to the ED with complaint of chest pain, epigastric pain, fatigue. The complaint involves an extensive differential diagnosis and also carries with it a high risk of complications and morbidity.  Serious etiology was considered. Ddx includes but is not limited to: Differential includes all life-threatening causes for chest pain. This includes but is not exclusive to acute coronary syndrome, aortic dissection, pulmonary embolism, cardiac tamponade, community-acquired pneumonia, pericarditis, musculoskeletal chest wall pain, etc. Differential diagnosis includes but is not exclusive to acute cholecystitis, intrathoracic causes for epigastric abdominal pain, gastritis, duodenitis, pancreatitis, small bowel or large bowel obstruction, abdominal aortic aneurysm, hernia, gastritis, etc.   Complete initial physical exam performed, notably the patient was in no acute distress, resting comfortably.    Reviewed and confirmed nursing documentation for past medical history, family history, social history.  Vital signs reviewed.    Chest pain Epigastric pain > - Burning sensation to lower chest, epigastrium; worse with p.o. intake.  Worsened when he would drink alcohol.    Reported hx aortic aneurysm > - check CT, pt reports has not followed up w/ surgery or had rpt imaging since diagnosis      ***               Additional history obtained: -Additional history obtained from {wsadditionalhistorian:28072} -External records from outside source obtained and reviewed including: Chart review including previous notes, labs, imaging, consultation notes including  ***   Lab Tests: -I ordered, reviewed, and interpreted labs.   The pertinent results include:   Labs Reviewed  CBC  BASIC METABOLIC PANEL WITH GFR  HEPATIC FUNCTION PANEL   LIPASE, BLOOD  TROPONIN I (HIGH SENSITIVITY)    Notable for ***  EKG   EKG Interpretation Date/Time:  Monday August 09 2023 09:17:08 EDT Ventricular Rate:  77 PR Interval:  158 QRS Duration:  90 QT Interval:  439 QTC Calculation: 497 R Axis:   27  Text Interpretation: Sinus rhythm Probable left atrial enlargement Borderline prolonged QT interval Confirmed by Elnor Savant ([id number]) on 08/09/2023 9:24:52 AM         Imaging Studies ordered: I ordered imaging studies including *** I independently visualized the following imaging with scope  of interpretation limited to determining acute life threatening conditions related to emergency care; findings noted above I agree with the radiologist interpretation If any imaging was obtained with contrast I closely monitored patient for any possible adverse reaction a/w contrast administration in the emergency department   Medicines ordered and prescription drug management: Meds ordered this encounter  Medications   alum & mag hydroxide-simeth (MAALOX/MYLANTA) 200-200-20 MG/5ML suspension 30 mL   sodium chloride  0.9 % bolus 1,000 mL    -I have reviewed the patients home medicines and have made adjustments as needed   Consultations Obtained: I requested consultation with the ***,  and discussed lab and imaging findings as well as pertinent plan - they recommend: ***   Cardiac Monitoring: The patient was maintained on a cardiac monitor.  I personally viewed and interpreted the cardiac monitored which showed an underlying rhythm of: *** Continuous pulse oximetry interpreted by myself, ***% on ***.    Social Determinants of Health:  Diagnosis or treatment significantly limited by social determinants of health: {wssoc:28071}   Reevaluation: After the interventions noted above, I reevaluated the patient and found that they have {resolved/improved/worsened:23923::improved}  Co morbidities that complicate the patient evaluation  Past  Medical History:  Diagnosis Date   BRBPR (bright red blood per rectum)    kidney stones       Dispostion: Disposition decision including need for hospitalization was considered, and patient {wsdispo:28070::discharged from emergency department.}    Final Clinical Impression(s) / ED Diagnoses Final diagnoses:  None

## 2023-08-09 NOTE — ED Triage Notes (Signed)
 Pt c/o mid chest pain sharp squeeze starting Saturday while at work and progressing into Sunday. Pt rated 8/10. Pt thought was just acid reflux but concerned because today he feels lethargic just can't wake up. Pt has history of aneurysm and heard surgery as a child.

## 2023-08-09 NOTE — Discharge Instructions (Signed)
 It was a pleasure caring for you today in the emergency department.  Please return to the emergency department for any worsening or worrisome symptoms.

## 2023-08-10 ENCOUNTER — Telehealth (HOSPITAL_COMMUNITY): Payer: Self-pay | Admitting: Emergency Medicine

## 2023-08-10 DIAGNOSIS — R072 Precordial pain: Secondary | ICD-10-CM

## 2023-08-10 NOTE — Telephone Encounter (Signed)
Sent cardiology referral .

## 2023-10-27 ENCOUNTER — Encounter (INDEPENDENT_AMBULATORY_CARE_PROVIDER_SITE_OTHER): Payer: Self-pay | Admitting: Gastroenterology

## 2023-11-14 NOTE — Progress Notes (Addendum)
 Cardiology Office Note   Date:  11/17/2023   ID:  Randall Allison, DOB November 27, 1975, MRN [id number]  PCP:  Trudy Vaughn FALCON, MD  Cardiologist:   Lynwood Schilling, MD Referring:  Trudy Vaughn FALCON, MD  Chief Complaint  Patient presents with   Loss of Consciousness      History of Present Illness: Randall Allison is a 48 y.o. male who presents for evaluation of precordial chest pain and syncope.  He has a history of a hole in my heart.  As a child.  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  He was followed by cardiologist at [facility].  His last evaluation was 2023.  He had an echocardiogram with an EF of 45%.  MRI demonstrated sinus of Valsalva 45 mm.  He was not compliant with medications for his slightly reduced ejection fraction.  He had an episode of syncope around that time without clear etiology.  He had an episode of syncope in July and was in the emergency room at [facility].  I reviewed those records for this visit.  He also mention some precordial chest discomfort at that time though he is not really describing this now.  His evaluation in the emergency room included troponin 22.  There was a CT which did not mention any aortic dilatation but did rule out aortic dissection and pulmonary embolism.  He says that he had another syncopal episode.  He was walking about 25 yards when he felt a little dizzy and went down.  He denies any orthostatic symptoms.  He does not really feel any palpitations.  He will occasionally get some diaphoresis unprovoked.  He does not describe chest pressure, neck or arm discomfort.  He has not had any new shortness of breath, PND or orthopnea.  He had no weight gain or edema.  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     Past Medical History:  Diagnosis Date   BRBPR (bright red blood per rectum)    kidney stones     Past Surgical History:  Procedure Laterality Date    CARDIAC SURGERY     49 years old   COLONOSCOPY N/A 01/04/2019   Procedure: COLONOSCOPY;  Surgeon: Golda Claudis PENNER, MD;  [facility];  Service: Endoscopy;  Laterality: N/A;  125   ESOPHAGOGASTRODUODENOSCOPY N/A 01/04/2019   Procedure: ESOPHAGOGASTRODUODENOSCOPY (EGD);  Surgeon: Golda Claudis PENNER, MD;  [facility];  Service: Endoscopy;  Laterality: N/A;   POLYPECTOMY  01/04/2019   Procedure: POLYPECTOMY;  Surgeon: Golda Claudis PENNER, MD;  [facility];  Service: Endoscopy;;   UMBILICAL HERNIA REPAIR N/A 11/11/2018   Procedure: HERNIA REPAIR SUPRAUMBILICAL ADULT;  Surgeon: Kallie Manuelita BROCKS, MD;  Location: [facility];  Service: General;  Laterality: N/A;     Current Outpatient Medications  Medication Sig Dispense Refill   cyanocobalamin (VITAMIN B12) 1000 MCG tablet Take 1,000 mcg by mouth daily.     pantoprazole  (PROTONIX ) 20 MG tablet Take 1 tablet (20 mg total) by mouth daily for 14 days. (Patient not taking: Reported on 11/17/2023) 14 tablet 0   sucralfate  (CARAFATE ) 1 g tablet Take 1 tablet (1 g total) by mouth with breakfast, with lunch, and with evening meal for 7 days. (Patient not taking: Reported on 11/17/2023) 21 tablet 0   No current facility-administered medications for this visit.    Allergies:   Patient has no known allergies.  Social History:  The patient  reports that he has been smoking cigarettes. He has a 7.5 pack-year smoking history. He has never used smokeless tobacco. He reports current alcohol use. He reports that he does not use drugs.   Family History:  The patient's family history includes Hernia in his brother and father; Prostate cancer in his father; Renal cancer in his father.    ROS:  Please see the history of present illness.   Otherwise, review of systems are positive for none.   All other systems are reviewed and negative.    PHYSICAL EXAM: VS:  BP 100/70   Pulse (!) 101   Ht 5' 8 (1.727 m)   Wt 154 lb (69.9 kg)    BMI 23.42 kg/m  , BMI Body mass index is 23.42 kg/m. GENERAL:  Well appearing HEENT:  Pupils equal round and reactive, fundi not visualized, oral mucosa unremarkable NECK:  No jugular venous distention, waveform within normal limits, carotid upstroke brisk and symmetric, no bruits, no thyromegaly LYMPHATICS:  No cervical, inguinal adenopathy LUNGS:  Clear to auscultation bilaterally BACK:  No CVA tenderness CHEST:  Unremarkable HEART:  PMI not displaced or sustained,S1 and S2 within normal limits, no S3, no S4, no clicks, no rubs, no murmurs ABD:  Flat, positive bowel sounds normal in frequency in pitch, no bruits, no rebound, no guarding, no midline pulsatile mass, no hepatomegaly, no splenomegaly EXT:  2 plus pulses throughout, no edema, no cyanosis no clubbing SKIN:  No rashes no nodules NEURO:  Cranial nerves II through XII grossly intact, motor grossly intact throughout PSYCH:  Cognitively intact, oriented to person place and time    EKG:  EKG Interpretation Date/Time:  Wednesday November 17 2023 15:12:29 EST Ventricular Rate:  101 PR Interval:  152 QRS Duration:  86 QT Interval:  360 QTC Calculation: 466 R Axis:   56  Text Interpretation: Sinus tachycardia Minimal voltage criteria for LVH, may be normal variant When compared with ECG of 09-Aug-2023 09:17,  rate is faster Confirmed by Lavona Agent ([id number]) on 11/17/2023 3:30:55 PM     Recent Labs: 08/09/2023: ALT 17; BUN 12; Creatinine, Ser 0.93; Hemoglobin 15.2; Platelets 226; Potassium 4.2; Sodium 135    Lipid Panel No results found for: CHOL, TRIG, HDL, CHOLHDL, VLDL, LDLCALC, LDLDIRECT    Wt Readings from Last 3 Encounters:  11/17/23 154 lb (69.9 kg)  08/09/23 157 lb (71.2 kg)  01/20/22 151 lb (68.5 kg)      Other studies Reviewed: Additional studies/ records that were reviewed today include: [facility] records. Review of the above records demonstrates:  Please see elsewhere in the note.      ASSESSMENT AND PLAN:   Precordial chest pain: The chest pain is nonanginal.  It is vague.  It is not reproducible.  No further workup.  Syncope: This seems to have been unprovoked.  I am going to start with a follow-up echocardiogram and 4-week monitor.  I will have a low threshold for wraps and exercise MRI or echocardiogram to make sure there is no dynamic outflow obstruction as there was not mention of this from his previous MRI with a question of the left aortic sinus producing a mass effect but without obvious hemodynamic obstruction.  Of note he has been told that he cannot drive.  Aortic aneurysm: This will be evaluated as above  Cardiomyopathy: This is nonischemic apparently.  I am going to repeat an echocardiogram.  He seems to be euvolemic.  Tobacco abuse: He  is trying to quit smoking and we did talk about this.  Current medicines are reviewed at length with the patient today.  The patient does not have concerns regarding medicines.  The following changes have been made:  no change  Labs/ tests ordered today include:   Orders Placed This Encounter  Procedures   CARDIAC EVENT MONITOR   EKG 12-Lead   ECHOCARDIOGRAM COMPLETE     Disposition:   FU with me after the above studies.     Signed, Lynwood Schilling, MD  11/17/2023 4:23 PM    [facility]

## 2023-11-17 ENCOUNTER — Encounter: Payer: Self-pay | Admitting: Cardiology

## 2023-11-17 ENCOUNTER — Ambulatory Visit (INDEPENDENT_AMBULATORY_CARE_PROVIDER_SITE_OTHER): Admitting: Cardiology

## 2023-11-17 VITALS — BP 100/70 | HR 101 | Ht 68.0 in | Wt 154.0 lb

## 2023-11-17 DIAGNOSIS — R072 Precordial pain: Secondary | ICD-10-CM | POA: Diagnosis not present

## 2023-11-17 DIAGNOSIS — R55 Syncope and collapse: Secondary | ICD-10-CM | POA: Diagnosis not present

## 2023-11-17 NOTE — Patient Instructions (Addendum)
 Medication Instructions:  Your physician recommends that you continue on your current medications as directed. Please refer to the Current Medication list given to you today.  Labwork: none  Testing/Procedures: Your physician has requested that you have an echocardiogram. Echocardiography is a painless test that uses sound waves to create images of your heart. It provides your doctor with information about the size and shape of your heart and how well your heart's chambers and valves are working. This procedure takes approximately one hour. There are no restrictions for this procedure. Please do NOT wear cologne, perfume, aftershave, or lotions (deodorant is allowed). Please arrive 15 minutes prior to your appointment time.  Please note: We ask at that you not bring children with you during ultrasound (echo/ vascular) testing. Due to room size and safety concerns, children are not allowed in the ultrasound rooms during exams. Our front office staff cannot provide observation of children in our lobby area while testing is being conducted. An adult accompanying a patient to their appointment will only be allowed in the ultrasound room at the discretion of the ultrasound technician under special circumstances. We apologize for any inconvenience. Your physician has recommended that you wear an event monitor for 30 days. Event monitors are medical devices that record the heart's electrical activity. Doctors most often us  these monitors to diagnose arrhythmias. Arrhythmias are problems with the speed or rhythm of the heartbeat. The monitor is a small, portable device. You can wear one while you do your normal daily activities. This is usually used to diagnose what is causing palpitations/syncope (passing out). [employer] will mail this to your home address.  Follow-Up: Your physician recommends that you schedule a follow-up appointment in: 6-8 weeks  Any Other Special Instructions Will Be Listed  Below (If Applicable).  If you need a refill on your cardiac medications before your next appointment, please call your pharmacy.

## 2023-11-25 ENCOUNTER — Ambulatory Visit

## 2023-11-25 DIAGNOSIS — R55 Syncope and collapse: Secondary | ICD-10-CM

## 2023-11-30 ENCOUNTER — Ambulatory Visit: Attending: Cardiology

## 2023-11-30 DIAGNOSIS — R55 Syncope and collapse: Secondary | ICD-10-CM | POA: Diagnosis not present

## 2023-11-30 DIAGNOSIS — R072 Precordial pain: Secondary | ICD-10-CM | POA: Diagnosis not present

## 2023-11-30 LAB — ECHOCARDIOGRAM COMPLETE
AR max vel: 3.4 cm2
AV Peak grad: 5 mmHg
Ao pk vel: 1.12 m/s
Area-P 1/2: 5.54 cm2
Calc EF: 42.4 %
S' Lateral: 4.2 cm
Single Plane A2C EF: 37.8 %
Single Plane A4C EF: 44.2 %

## 2023-12-05 ENCOUNTER — Ambulatory Visit: Payer: Self-pay | Admitting: Cardiology

## 2023-12-15 ENCOUNTER — Telehealth: Payer: Self-pay | Admitting: Cardiology

## 2023-12-15 MED ORDER — METOPROLOL SUCCINATE ER 25 MG PO TB24: 25.0000 mg | ORAL_TABLET | Freq: Every day | ORAL | 3 refills | Status: AC

## 2023-12-15 NOTE — Telephone Encounter (Signed)
 [name] with [employer] calling to report critical EKG. Called triage, no answer yet. [name] disconnected call.

## 2023-12-15 NOTE — Telephone Encounter (Signed)
 Randall Allison called back again. I tried calling triage back, no answer yet. Can someone call her back please

## 2023-12-15 NOTE — Telephone Encounter (Signed)
 Spoke with Hadassah from [employer].  1:22 Sinus tacy 207     Cardiac Monitor Alert  Date of alert:  12/15/2023   Patient Name: Randall Allison  DOB: 01/27/1975  MRN: [id number]   [facility] Cardiologist: Lynwood Schilling, MD  [facility] EP:  None    Monitor Information: Cardiac Event Monitor [[employer]]  Reason:  Syncope and collapse  Ordering provider:  Hochrein    Alert Ventricular Tachycardia This is the 1st alert for this rhythm.   Next Cardiology Appointment   Date:  01/26/24  Provider:  Schilling   The patient was contacted today.  He is asymptomatic. Arrhythmia, symptoms and history reviewed with Dr. Pietro.   Plan:  Start Toprol-XL 25 mg once daily, show report to Dr. Schilling.   Medication sent to preferred pharmacy.   Other: [name], RN  12/15/2023 12:35 PM

## 2023-12-17 NOTE — Telephone Encounter (Signed)
 Randall Allison, I think a stress MRI sounds very reasonable, the order is under MRI cardiac stress [place]

## 2023-12-20 ENCOUNTER — Other Ambulatory Visit: Payer: Self-pay

## 2023-12-20 DIAGNOSIS — Z01812 Encounter for preprocedural laboratory examination: Secondary | ICD-10-CM

## 2023-12-20 DIAGNOSIS — I4729 Other ventricular tachycardia: Secondary | ICD-10-CM

## 2023-12-20 NOTE — Progress Notes (Unsigned)
 Cardiac MRI stress and lab work ordered. Attestation pended in chart for provider to sign.

## 2023-12-20 NOTE — Telephone Encounter (Signed)
 Spoke with pt and let him know that a stress MRI has been ordered. Pt will receive call to schedule. Pt verbalized understanding. All questions if any were answered.

## 2024-01-24 DIAGNOSIS — I429 Cardiomyopathy, unspecified: Secondary | ICD-10-CM | POA: Insufficient documentation

## 2024-01-24 DIAGNOSIS — R072 Precordial pain: Secondary | ICD-10-CM | POA: Insufficient documentation

## 2024-01-24 DIAGNOSIS — R55 Syncope and collapse: Secondary | ICD-10-CM | POA: Insufficient documentation

## 2024-01-24 DIAGNOSIS — Z720 Tobacco use: Secondary | ICD-10-CM | POA: Insufficient documentation

## 2024-01-24 NOTE — Progress Notes (Unsigned)
 " Cardiology Office Note:   Date:  01/26/2024  ID:  Randall Allison, DOB 05/26/1975, MRN [id number] PCP: Trudy Vaughn FALCON, MD   [facility] Providers Cardiologist:  Lynwood Schilling, MD {  History of Present Illness:   Randall Allison is a 49 y.o. male who presents for evaluation of precordial chest pain and syncope.  He has a history of a hole in my heart.  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  He was followed by cardiologist at [facility].  His last evaluation was 2023.  He had an echocardiogram with an EF of 45%.  MRI demonstrated sinus of Valsalva 45 mm.  He was not compliant with medications for his slightly reduced ejection fraction.  He had an episode of syncope around that time without clear etiology.  He had an episode of syncope in July 2025 and was in the emergency room at [facility].   He also mention some precordial chest discomfort at that time though he is not really describing this now.  His evaluation in the emergency room included troponin 22.  There was a CT which did not mention any aortic dilatation but did rule out aortic dissection and pulmonary embolism. He had another syncopal episode prior to my appt with him in Nov 2025. He wore a monitor and had frequent runs of NSVT with the longest run being 14 beats.  Echo demonstrated an EF of 40 - 45% without residual VSD shunting.  He had mild MR.  The aorta was 43 mm.  I ordered a stress MRI.    Thankfully he has had no further syncope and has not felt any palpitations since I saw him.  We were trying to get the MRI ordered but his Medicaid was cut off and he does not want to have any further testing until he gets that sorted out.  I have prescribed 25 mg of Toprol -XL but he is only taking half of that because he was tired.  He has not had any presyncope or syncope.  He said no weight gain or edema.   ROS: As stated in the HPI and negative for all other  systems.  Studies Reviewed:    EKG:     NA  Risk Assessment/Calculations:              Physical Exam:   VS:  BP 124/78 (BP Location: Left Arm, Patient Position: Sitting, Cuff Size: Normal)   Pulse 68   Resp 16   Ht 5' 8 (1.727 m)   Wt 161 lb 12.8 oz (73.4 kg)   SpO2 96%   BMI 24.60 kg/m    Wt Readings from Last 3 Encounters:  01/26/24 161 lb 12.8 oz (73.4 kg)  11/17/23 154 lb (69.9 kg)  08/09/23 157 lb (71.2 kg)     GEN: Well nourished, well developed in no acute distress NECK: No JVD; No carotid bruits CARDIAC: RRR, 3 out of 6 apical systolic murmur radiates slightly at the aortic outflow tract, no diastolic murmurs, rubs, gallops RESPIRATORY:  Clear to auscultation without rales, wheezing or rhonchi  ABDOMEN: Soft, non-tender, non-distended EXTREMITIES:  No edema; No deformity   ASSESSMENT AND PLAN:     Syncope:   The patient had no further syncope.  A very worried that he might have had a dysrhythmia as an etiology.  I want to do a stress MRI.   At this point I do not have data to suggest that  this was related to the nonsustained VT.  With the MRI would be looking for ischemia or scar as well as more structural images.  He does not want to schedule this without his Medicaid in place and to work with the child psychotherapist and let me know.  I have requested that he take the Toprol  25 mg as previously prescribed.  Can take this at night since it is causing fatigue.    Aortic aneurysm: This seems to be stable on the echo but I would size this with an MRI as above.    Cardiomyopathy:   This was thought to be nonischemic and possibly related to his previous surgery but again I was planning to rule out ischemia as above.  He understands importance of this.   Fatigue:  I would like to check a TSH.   Tobacco abuse: We have talked about the need to quit smoking.      Follow up with me in about 2 months.  Signed, Lynwood Schilling, MD   "

## 2024-01-26 ENCOUNTER — Encounter: Payer: Self-pay | Admitting: Cardiology

## 2024-01-26 ENCOUNTER — Ambulatory Visit: Payer: Self-pay | Admitting: Cardiology

## 2024-01-26 VITALS — BP 124/78 | HR 68 | Resp 16 | Ht 68.0 in | Wt 161.8 lb

## 2024-01-26 DIAGNOSIS — Z720 Tobacco use: Secondary | ICD-10-CM

## 2024-01-26 DIAGNOSIS — I429 Cardiomyopathy, unspecified: Secondary | ICD-10-CM

## 2024-01-26 DIAGNOSIS — R072 Precordial pain: Secondary | ICD-10-CM

## 2024-01-26 DIAGNOSIS — R55 Syncope and collapse: Secondary | ICD-10-CM

## 2024-01-26 NOTE — Patient Instructions (Addendum)
 Medication Instructions:  Your physician recommends that you continue on your current medications as directed. Please refer to the Current Medication list given to you today.  Labwork: TSH  Testing/Procedures: none  Follow-Up: Your physician recommends that you schedule a follow-up appointment in: 2 months  Any Other Special Instructions Will Be Listed Below (If Applicable).  If you need a refill on your cardiac medications before your next appointment, please call your pharmacy.

## 2024-04-19 ENCOUNTER — Ambulatory Visit: Payer: Self-pay | Admitting: Cardiology
